# Patient Record
Sex: Female | Born: 1935 | Race: White | Hispanic: No | Smoking: Never smoker
Health system: Southern US, Community
[De-identification: ages and names within clinical notes are randomized; demographics above are authoritative.]

## PROBLEM LIST (undated history)

## (undated) DIAGNOSIS — R002 Palpitations: Secondary | ICD-10-CM

## (undated) DIAGNOSIS — Z66 Do not resuscitate: Secondary | ICD-10-CM

## (undated) DIAGNOSIS — Z9889 Other specified postprocedural states: Secondary | ICD-10-CM

## (undated) DIAGNOSIS — R110 Nausea: Secondary | ICD-10-CM

## (undated) DIAGNOSIS — R55 Syncope and collapse: Secondary | ICD-10-CM

## (undated) DIAGNOSIS — C801 Malignant (primary) neoplasm, unspecified: Secondary | ICD-10-CM

## (undated) DIAGNOSIS — I10 Essential (primary) hypertension: Secondary | ICD-10-CM

## (undated) DIAGNOSIS — K219 Gastro-esophageal reflux disease without esophagitis: Secondary | ICD-10-CM

## (undated) DIAGNOSIS — K591 Functional diarrhea: Secondary | ICD-10-CM

## (undated) DIAGNOSIS — H269 Unspecified cataract: Secondary | ICD-10-CM

## (undated) DIAGNOSIS — I639 Cerebral infarction, unspecified: Secondary | ICD-10-CM

## (undated) DIAGNOSIS — R112 Nausea with vomiting, unspecified: Secondary | ICD-10-CM

## (undated) DIAGNOSIS — F0390 Unspecified dementia without behavioral disturbance: Secondary | ICD-10-CM

## (undated) DIAGNOSIS — I2510 Atherosclerotic heart disease of native coronary artery without angina pectoris: Secondary | ICD-10-CM

## (undated) DIAGNOSIS — H47099 Other disorders of optic nerve, not elsewhere classified, unspecified eye: Secondary | ICD-10-CM

## (undated) DIAGNOSIS — G450 Vertebro-basilar artery syndrome: Secondary | ICD-10-CM

## (undated) DIAGNOSIS — J189 Pneumonia, unspecified organism: Secondary | ICD-10-CM

## (undated) DIAGNOSIS — D649 Anemia, unspecified: Secondary | ICD-10-CM

## (undated) DIAGNOSIS — R5382 Chronic fatigue, unspecified: Secondary | ICD-10-CM

## (undated) HISTORY — DX: Cerebral infarction, unspecified: I63.9

## (undated) HISTORY — DX: Anemia, unspecified: D64.9

## (undated) HISTORY — DX: Atherosclerotic heart disease of native coronary artery without angina pectoris: I25.10

## (undated) HISTORY — PX: CATARACT EXTRACTION, BILATERAL: SHX1313

## (undated) HISTORY — PX: RECTAL POLYPECTOMY: SHX2309

## (undated) HISTORY — DX: Essential (primary) hypertension: I10

## (undated) HISTORY — DX: Vertebro-basilar artery syndrome: G45.0

## (undated) HISTORY — PX: POLYPECTOMY: SHX149

## (undated) HISTORY — DX: Nausea: R11.0

## (undated) HISTORY — DX: Other disorders of optic nerve, not elsewhere classified, unspecified eye: H47.099

## (undated) HISTORY — DX: Unspecified cataract: H26.9

## (undated) HISTORY — DX: Syncope and collapse: R55

## (undated) HISTORY — DX: Gastro-esophageal reflux disease without esophagitis: K21.9

## (undated) HISTORY — DX: Palpitations: R00.2

## (undated) HISTORY — PX: CHOLECYSTECTOMY: SHX55

## (undated) HISTORY — DX: Do not resuscitate: Z66

## (undated) HISTORY — DX: Chronic fatigue, unspecified: R53.82

## (undated) HISTORY — PX: UPPER GASTROINTESTINAL ENDOSCOPY: SHX188

## (undated) HISTORY — PX: COLONOSCOPY: SHX174

## (undated) HISTORY — PX: OTHER SURGICAL HISTORY: SHX169

---

## 1998-07-15 ENCOUNTER — Other Ambulatory Visit: Admission: RE | Admit: 1998-07-15 | Discharge: 1998-07-15 | Payer: Self-pay | Admitting: Family Medicine

## 1999-09-16 ENCOUNTER — Other Ambulatory Visit: Admission: RE | Admit: 1999-09-16 | Discharge: 1999-09-16 | Payer: Self-pay | Admitting: Family Medicine

## 2000-11-15 ENCOUNTER — Other Ambulatory Visit: Admission: RE | Admit: 2000-11-15 | Discharge: 2000-11-15 | Payer: Self-pay | Admitting: Family Medicine

## 2001-06-06 ENCOUNTER — Encounter (INDEPENDENT_AMBULATORY_CARE_PROVIDER_SITE_OTHER): Payer: Self-pay | Admitting: *Deleted

## 2001-08-09 ENCOUNTER — Encounter (INDEPENDENT_AMBULATORY_CARE_PROVIDER_SITE_OTHER): Payer: Self-pay | Admitting: *Deleted

## 2003-11-24 ENCOUNTER — Emergency Department (HOSPITAL_COMMUNITY): Admission: EM | Admit: 2003-11-24 | Discharge: 2003-11-24 | Payer: Self-pay | Admitting: Emergency Medicine

## 2003-11-29 ENCOUNTER — Encounter (INDEPENDENT_AMBULATORY_CARE_PROVIDER_SITE_OTHER): Payer: Self-pay | Admitting: *Deleted

## 2003-11-29 ENCOUNTER — Ambulatory Visit (HOSPITAL_COMMUNITY): Admission: RE | Admit: 2003-11-29 | Discharge: 2003-11-29 | Payer: Self-pay | Admitting: Cardiology

## 2003-12-17 ENCOUNTER — Encounter: Admission: RE | Admit: 2003-12-17 | Discharge: 2003-12-17 | Payer: Self-pay | Admitting: Cardiology

## 2004-05-23 ENCOUNTER — Ambulatory Visit (HOSPITAL_COMMUNITY): Admission: RE | Admit: 2004-05-23 | Discharge: 2004-05-23 | Payer: Self-pay | Admitting: Ophthalmology

## 2004-08-22 ENCOUNTER — Encounter: Admission: RE | Admit: 2004-08-22 | Discharge: 2004-08-22 | Payer: Self-pay | Admitting: Orthopedic Surgery

## 2005-03-16 ENCOUNTER — Other Ambulatory Visit: Admission: RE | Admit: 2005-03-16 | Discharge: 2005-03-16 | Payer: Self-pay | Admitting: Family Medicine

## 2005-03-30 DIAGNOSIS — R55 Syncope and collapse: Secondary | ICD-10-CM

## 2005-03-30 HISTORY — DX: Syncope and collapse: R55

## 2005-04-02 HISTORY — PX: TRANSTHORACIC ECHOCARDIOGRAM: SHX275

## 2005-10-04 ENCOUNTER — Emergency Department (HOSPITAL_COMMUNITY): Admission: EM | Admit: 2005-10-04 | Discharge: 2005-10-04 | Payer: Self-pay | Admitting: Emergency Medicine

## 2006-11-12 ENCOUNTER — Inpatient Hospital Stay (HOSPITAL_COMMUNITY): Admission: EM | Admit: 2006-11-12 | Discharge: 2006-11-14 | Payer: Self-pay | Admitting: Emergency Medicine

## 2008-06-27 ENCOUNTER — Encounter (INDEPENDENT_AMBULATORY_CARE_PROVIDER_SITE_OTHER): Payer: Self-pay | Admitting: *Deleted

## 2009-04-16 ENCOUNTER — Telehealth: Payer: Self-pay | Admitting: Internal Medicine

## 2009-11-10 ENCOUNTER — Emergency Department (HOSPITAL_COMMUNITY): Admission: EM | Admit: 2009-11-10 | Discharge: 2009-11-10 | Payer: Self-pay | Admitting: Emergency Medicine

## 2009-12-06 ENCOUNTER — Ambulatory Visit: Payer: Self-pay | Admitting: Cardiology

## 2010-03-19 ENCOUNTER — Ambulatory Visit: Payer: Self-pay | Admitting: Cardiology

## 2010-04-20 ENCOUNTER — Encounter: Payer: Self-pay | Admitting: Cardiology

## 2010-05-01 NOTE — Procedures (Signed)
Summary: COLON   Colonoscopy  Procedure date:  08/09/2001  Findings:      Location:  [facility].   Patient Name: Sheryl Shaw, Argueta MRN:  Procedure Procedures: Colonoscopy CPT: [phone].  Personnel: Endoscopist: Ulyess Mort, MD.  Exam Location: Exam performed in Outpatient Clinic. Outpatient  Patient Consent: Procedure, Alternatives, Risks and Benefits discussed, consent obtained, from patient. Consent was obtained by the RN.  Indications Symptoms: Diarrhea  Surveillance of: Adenomatous Polyp(s).  History Allergies: Allergic to codeine.  Pre-Exam Physical: Performed Aug 09, 2001. Abdominal exam, Neurological exam, Mental status exam WNL.  Exam Exam: Extent of exam reached: Cecum, extent intended: Cecum.  The cecum was identified by appendiceal orifice and IC valve. Colon retroflexion performed. Images were not taken. ASA Classification: II. Tolerance: good.  Monitoring: Pulse and BP monitoring, Oximetry used. Supplemental O2 given.  Colon Prep Prep results: fair, exam compromised.  Sedation Meds: Patient assessed and found to be appropriate for moderate (conscious) sedation. Fentanyl 100 mcg. given IV. Versed 5 mg. given IV.  Findings - DIVERTICULOSIS: Descending Colon to Sigmoid Colon. ICD9: Diverticulosis: 562.10. Comments: minnimal.  - STRICTURE / STENOSIS: Comments: anal sphincter scarring. ICD9: Anal Stricture: 569.2.   Assessment Abnormal examination, see findings above.  Diagnoses: 562.10: Diverticulosis.  569.2: Anal Stricture.   Events  Unplanned Interventions: No intervention was required.  Unplanned Events: There were no complications. Plans Medication Plan: Continue current medications.  Patient Education: Patient given standard instructions for: Yearly hemoccult testing recommended. Patient instructed to get routine colonoscopy every 5 years.  Disposition: After procedure patient sent to recovery. After recovery  patient sent home.   This report was created from the original endoscopy report, which was reviewed and signed by the above listed endoscopist.

## 2010-05-01 NOTE — Assessment & Plan Note (Signed)
Summary: Gastroenterology (Dr Corinda Gubler)  [name]  MR#:  [id number] Page #  [facility]   GASTROENTEROLOGY OFFICE NOTE  NAME:  Sheryl Shaw, Sheryl Shaw   OFFICE NO:  [id number]  DATE:  06/06/01  CHIEF COMPLAINT:  The patient comes in on 3/10 and says she is doing well.  No GI complaints.  She has an upper respiratory infection and just came back from [place].  The patient does have GERD and has a 4 cm hiatal hernia.  Last time she was endoscoped was in 1996 and was dilated accordingly with a 52 Maloney dilator.  She says she is really doing fairly well otherwise.  She said the Prilosec caused her some palpitations and she did not like the generic and wanted to go back to the regular.  PHYSICAL EXAMINATION:  She weighed 172 pounds.  Blood pressure 140/80.  Pulse 76 and regular.  Neck, heart, abdomen and extremities are all unremarkable.  IMPRESSION:  1.   Gastroesophageal reflux disease with stricture and spasm. 2.   Status post colon polyp.  RECOMMENDATIONS:  She can take regular Prilosec or maybe Nexium.  I told her to let us know if she has further difficulty with her dysphagia.  I also think she needs a colonoscopic examination.  All I can see is that she had one in 1994 at which time she had a small polyp.  She was to have one repeated in five years and this has not been done and I would recommend a followup colonoscopic examination.  Maybe at that time she ought to be endoscoped and dilated but I will leave that up to her and see how she is doing symptomatically prior to considering that.     Ulyess Mort, M.D.  [id number] D:  06/06/01; T:  ; Job [id number]

## 2010-05-01 NOTE — Progress Notes (Signed)
Summary: Need Colonoscopy or Office Visit?  Phone Note Outgoing Call   Call placed by: Hortense Ramal CMA Duncan Dull),  April 16, 2009 2:40 PM Call placed to: Patient Summary of Call: Called patient to advise her that it is time for her colonoscopy. She states that she was told by Dr Modena Jansky (who apparently no longer practices) whenever she told him that she got a letter saying it was time for a colonoscopy that she had 2 more years until she needed another procedure. I told her that we would check with you to get your thoughts. Patient is 77 years old but from the information in her paper chart, she is in faily good health with the only problems listed being GERD, hx of peptic stricture, hiatal hernia, anal stricture, diverticulosis and history of colonic polyps. Those polyps were found to be hyperplastic on a colonoscopy done in 1994. Patient wants to know if you think she really needs a colonoscopy? (If you would rather wait to see the paper chart, I can tell her that I will call her back next week) Initial call taken by: Hortense Ramal CMA Duncan Dull),  April 16, 2009 2:44 PM  Follow-up for Phone Call        If she is a healthy 77 y.o, and her last colon was in 1994, then I would recommend her to have another colonoscopy. But it is her choice. She is due for it. Follow-up by: Hart Carwin MD,  April 17, 2009 2:19 PM  Additional Follow-up for Phone Call Additional follow up Details #1::        Her last colon was in 2003 (in emr for your review) but it was not impressive, the 1994 colon is the one that showed polyps    Additional Follow-up for Phone Call Additional follow up Details #2::    Well, then she really does not need a colonoscopy till 2013 ( 10 years). She will be 77 years old and she will not likely need  another exam unless she is having problems. Follow-up by: Hart Carwin MD,  April 17, 2009 2:31 PM  Additional Follow-up for Phone Call Additional follow up Details #3:: Details  for Additional Follow-up Action Taken: I have left a message for patient to call back and I have entered a new recall into ID[employer] and EMR. Hortense Ramal CMA Duncan Dull)  April 18, 2009 12:51 PM   patient aware we will put recall in for 2013 and we will contact her to see how she is doing at that time. she understand and says thank you. Additional Follow-up by: Harlow Mares CMA Duncan Dull),  April 18, 2009 2:24 PM

## 2010-06-13 LAB — DIFFERENTIAL
Basophils Absolute: 0.1 10*3/uL (ref 0.0–0.1)
Basophils Relative: 1 % (ref 0–1)
Lymphs Abs: 1.6 10*3/uL (ref 0.7–4.0)
Neutro Abs: 4.8 10*3/uL (ref 1.7–7.7)

## 2010-06-13 LAB — CBC
MCH: 31 pg (ref 26.0–34.0)
MCHC: 34.2 g/dL (ref 30.0–36.0)
MCV: 90.6 fL (ref 78.0–100.0)
Platelets: 246 10*3/uL (ref 150–400)
RDW: 14.1 % (ref 11.5–15.5)

## 2010-06-13 LAB — POCT CARDIAC MARKERS
Myoglobin, poc: 89.7 ng/mL (ref 12–200)
Troponin i, poc: <0.05 ng/mL (ref 0.00–0.09)

## 2010-06-13 LAB — BASIC METABOLIC PANEL
CO2: 31 mEq/L (ref 19–32)
Chloride: 106 mEq/L (ref 96–112)
GFR calc Af Amer: 58 mL/min — ABNORMAL LOW (ref >60)
Glucose, Bld: 119 mg/dL — ABNORMAL HIGH (ref 70–99)
Potassium: 3.4 mEq/L — ABNORMAL LOW (ref 3.5–5.1)

## 2010-07-29 ENCOUNTER — Other Ambulatory Visit: Payer: Self-pay | Admitting: *Deleted

## 2010-07-29 DIAGNOSIS — E7800 Pure hypercholesterolemia, unspecified: Secondary | ICD-10-CM

## 2010-08-12 NOTE — Discharge Summary (Signed)
Sheryl Shaw, Sheryl Shaw             ACCOUNT NO.:  [id number]   MEDICAL RECORD NO.:  [id number]          PATIENT TYPE:  INP   LOCATION:  1540                         FACILITY:  [facility]   PHYSICIAN:  Barnetta Chapel, MDDATE OF BIRTH:  10/04/1924   DATE OF ADMISSION:  11/11/2006  DATE OF DISCHARGE:  11/14/2006                               DISCHARGE SUMMARY   PRIMARY CARE Antwyne Pingree:  Dr. Marny Lowenstein.   DISCHARGE DIAGNOSES:  1. Nausea and vomiting.  2. Diarrhea.  3. Dehydration.  4. Abnormal LFTs.  5. History of coronary artery disease.   DISCHARGE MEDS:  1. Atenolol 50 mg p.o. once daily.  2. HCTZ 12.5 mg p.o. once daily.  3. Lisinopril 10 mg p.o. once daily.  4. Plavix 75 mg p.o. once daily.  5. Aspirin 81 mg p.o. once daily.   CONSULTATIONS DONE:  None.   PERTINENT LABS ON ADMISSION:  UA revealed small leukocyte esterase and  the urine microscopy revealed a few bacteria.  However, urine culture  did not grow any organism.  AST was 77, ALT was 47, alkaline phosphatase  was 163.   BRIEF HISTORY AND HOSPITAL COURSE:  Please refer to the H&P done on  November 11, 2006.  The patient is an 77 year old female with a past  medical history significant for  coronary artery disease and  hypertension.  The patient presented with nausea, vomiting and diarrhea.  The patient was unable to keep anything down.  Apparently, the patient  had gone to visit some of the friends who also had symptoms of  gastroenteritis.   The patient was admitted as a case of acute gastroenteritis.  She was  kept n.p.o.  The nausea and vomiting was controlled with IV Zofran.  The  patient was adequately rehydrated.  UA revealed possible sign of UTI.  Urine culture done did not grow any organisms.  With the above  management, the patient improved significantly.  Nausea and vomiting  have resolved.  The patient is no longer having diarrhea.  The patient  is back to her baseline.  She will be discharged home  today to the care  of the primary care Charise Leinbach.   DISCHARGE PLANS:  1. Discharge patient back home today, but to follow with th primary      care Bodi Palmeri, Dr. Marny Lowenstein, in a      week.  2. Cardiac diet.  3. Activity as tolerated.      Barnetta Chapel, MD  Electronically Signed     SIO/MEDQ  D:  11/14/2006  T:  11/14/2006  Job:  [id number]   cc:   Jethro Bastos, M.D.  Fax: [phone]

## 2010-08-12 NOTE — H&P (Signed)
NAMESELMA, Sheryl Shaw             ACCOUNT NO.:  [id number]   MEDICAL RECORD NO.:  [id number]          PATIENT TYPE:  INP   LOCATION:  1540                         FACILITY:  [facility]   PHYSICIAN:  Hollice Espy, M.D.DATE OF BIRTH:  09-Mar-1925   DATE OF ADMISSION:  11/11/2006  DATE OF DISCHARGE:                              HISTORY & PHYSICAL   PRIMARY CARE PHYSICIAN:  Jethro Bastos, M.D.   CHIEF COMPLAINT:  Nausea, vomiting and diarrhea.   HISTORY OF PRESENT ILLNESS:  The patient is an 77 year old, white female  with past medical history of CAD and hypertension who for the past 3  days has been having complaints of worsening, nausea, vomiting and  diarrhea to the point where she cannot keep anything down.  She  previously has been well with no complaints.  She notes no fever, no  abdominal pain, no chest pain and no other complaints, just these  symptoms.  She lives alone and her daughter was worried about her being  able to take care of herself, so she brought her in for further  evaluation.  In the emergency room, labs were drawn and she was found to  have an elevated BUN of 27, but with a normal creatinine of 0.95.  Her  albumin was slightly low at 3.4.  Her transaminases and Alk phos were  minimally elevated and her white count was normal with no shift.  An  abdominal x-ray showed signs consistent with viral gastroenteritis.  It  was felt that the patient needed to come as she is unable to take p.o.  Currently, she says she is feeling a little bit better, a little less  nauseated.  She denies any headaches, vision changes, dysphagia, chest  pain, palpitations, shortness of breath, wheeze, cough, abdominal pain,  no hematuria or dysuria and no constipation.  She currently is not  having any diarrhea.  She denies any focal extremity, numbness, weakness  or pain.  She had a sense of generalized weakness.  Review of systems  otherwise negative.   PAST MEDICAL HISTORY:  1.  CAD.  2. Hypertension.   MEDICATIONS:  Aspirin, atenolol, Plavix, hydrochlorothiazide and  lisinopril.   ALLERGIES:  CODEINE.   SOCIAL HISTORY:  She denies any tobacco, alcohol or drug use.  She lives  alone.   FAMILY HISTORY:  Noncontributory.   PHYSICAL EXAMINATION:  VITAL SIGNS:  On admission, temperature 97.3,  heart rate 69, blood pressure 125/56, respirations 20, O2 saturations  97% on room air.  GENERAL:  The patient is alert and oriented x3 in no apparent distress.  HEENT:  Normocephalic, atraumatic.  Mucous membranes are moist.  She has  no carotid bruits.  HEART:  Regular rate and rhythm, S1, S2.  LUNGS:  Clear to auscultation bilaterally.  ABDOMEN:  Soft, nontender, hyperactive bowel sounds.  EXTREMITIES:  No clubbing, cyanosis or edema.   LABORATORY DATA AND X-RAY FINDINGS:  Acute abdominal series shows signs  consistent with viral gastroenteritis.   White count 9.2, H&H 13.1 and 38, MCV 89, platelet count 274 with no  shift.  Sodium 137, potassium 3.5,  chloride 100, bicarb 26, BUN 27,  creatinine 1, glucose 108.  LFTs are noted for an albumin of 3.4 and AST  of 69, Alk phos of 115.  UA with small leukocyte esterase, but only few  bacteria with 3-6 wbc's.   ASSESSMENT/PLAN:  1. Nausea, vomiting and diarrhea.  This is likely secondary to a      gastroenteritis.  Will symptomatically treat with antinausea and      pain medication plus Imodium and Protonix.  2. Coronary artery disease.  Will hold her antihypertensives until she      is better hydrated.  3. Brief elevation in transaminases.  Will recheck liver function test      about 10 hours later to ensure these are going down.  The patient      already has a previous history of having her gallbladder removed.      Hollice Espy, M.D.  Electronically Signed     SKK/MEDQ  D:  11/12/2006  T:  11/12/2006  Job:  [id number]   cc:   Jethro Bastos, M.D.  Fax: [phone]

## 2010-08-15 NOTE — H&P (Signed)
Sheryl Shaw, Sheryl Shaw             ACCOUNT NO.:  [id number]   MEDICAL RECORD NO.:  [id number]          PATIENT TYPE:  OIB   LOCATION:  2899                         FACILITY:  [facility]   PHYSICIAN:  Guadelupe Sabin, M.D.DATE OF BIRTH:  [birth year]   DATE OF ADMISSION:  05/23/2004  DATE OF DISCHARGE:                                HISTORY & PHYSICAL   This was a planned outpatient surgical admission of this 77 year old white  female admitted for cataract implant surgery of the left eye.   PRESENT ILLNESS:  This patient was first seen in my office on November 01, 2003  complaining of blurred vision greater in the left than right eye over the  past six months' duration. Examination revealed a visual acuity of 20/50 +2  right eye, 20/50 +2 left eye without correction and 20/40 minus right eye,  20/40 left eye with correction at distance. Near vision 20/40 right eye,  20/65 left eye. The patient was given a changing glasses in hopes of  improving her vision. When she returned, however, in January 2006 vision had  continued to deteriorate to 20/60 with correction in the left eye. The  patient elected to proceed with cataract implant surgery hoping for improved  vision. She was given oral discussion and printed information concerning the  procedure and its possible complications. She signed an informed consent and  arrangements were made for her outpatient admission at this time.   PAST MEDICAL HISTORY:  The patient is under the care of Dr. Dorothe Pea. Please  send him copies of these notes and any associated, laboratory data, EKG, or  chest x-ray material. Dr. Dorothe Pea notes the patient's good health for her  age with the history of arteriosclerotic heart disease followed by Dr.  Deborah Chalk. The patient has been placed on Plavix and aspirin which she has  discontinued on hold since May 19, 2004. She is felt to be a low risk  for the proposed surgery under local anesthesia.   REVIEW OF SYSTEMS:   No cardiorespiratory complaints.   PHYSICAL EXAMINATION:  VITAL SIGNS: As recorded on admission: Blood pressure  126/60, pulse 58, respirations 16, temperature 98.6.  GENERAL APPEARANCE: The patient is a pleasant 77 year old white female in no  acute distress.  HEENT: Eyes: Visual acuity as noted above. External ocular: The eyes are  white and clear with a clear cornea, deep and clear anterior chamber.  Nuclear cataract formation is present in both eyes. Dilated detailed fundus  examination reveals a clear vitreous, attached retina with normal optic  nerve, blood vessels, and macula. The left macula shows water to drusen, but  no hemorrhage, exudation, or neovascular membrane formation.  CHEST: Lungs clear to percussion/auscultation.  HEART: Normal sinus rhythm. No cardiomegaly. No murmurs.  ABDOMEN: Negative.  EXTREMITIES: Negative.   ADMISSION DIAGNOSIS:  Senile nuclear cataract greater in left than right  eye.   SURGICAL PLAN:  Cataract implant surgery left eye now, right eye later as  needed.      HNJ/MEDQ  D:  05/23/2004  T:  05/23/2004  Job:  [id number]   cc:   Marvis Repress.  Dorothe Pea, M.D.  [address]  [place]  [place] [address]  Fax: [phone]

## 2010-08-15 NOTE — H&P (Signed)
NAMEPENNIE, VANBLARCOM             ACCOUNT NO.:  [id number]   MEDICAL RECORD NO.:  [id number]          PATIENT TYPE:  OIB   LOCATION:  2899                         FACILITY:  [facility]   PHYSICIAN:  Guadelupe Sabin, M.D.DATE OF BIRTH:  1924-12-02   DATE OF ADMISSION:  05/23/2004  DATE OF DISCHARGE:  05/23/2004                                HISTORY & PHYSICAL   Date of outpatient surgery was 05/23/04.  I believe that I had already  dictated this history and physical with dictation number [phone].  I have the  operative note which I dictated later, but the operative admission was  recorded on this number.  Could you please check that for me and also send  me a copy of the admission note for my file.      HNJ/MEDQ  D:  07/27/2004  T:  07/27/2004  Job:  [id number]

## 2010-08-15 NOTE — Op Note (Signed)
NAMEDEROTHA, FISHBAUGH             ACCOUNT NO.:  [id number]   MEDICAL RECORD NO.:  [id number]          PATIENT TYPE:  OIB   LOCATION:  2899                         FACILITY:  [facility]   PHYSICIAN:  Guadelupe Sabin, M.D.DATE OF BIRTH:  Feb 25, 1925   DATE OF PROCEDURE:  05/23/2004  DATE OF DISCHARGE:                                 OPERATIVE REPORT   PREOPERATIVE DIAGNOSIS:  Senile nuclear cataract, left eye.   POSTOPERATIVE DIAGNOSIS:  Senile nuclear cataract, left eye.   NAME OF OPERATION:  Planned extracapsular cataract extraction --  phacoemulsification, primary insertion of posterior chamber intraocular lens  implant.   SURGEON:  Guadelupe Sabin, M.D.   ASSISTANT:  Nurse.   ANESTHESIA:  Local 4% Xylocaine, 0.75% Marcaine retrobulbar block with  Wydase added, topical tetracaine, intraocular Xylocaine, anesthesia standby  required in this elderly patient, patient given sodium Pentothal  intravenously during the period of retrobulbar blocking.   OPERATIVE PROCEDURE:  After the patient was prepped and draped, a lid  speculum was inserted and the left eye.  The eye was turned downward and a  superior rectus traction suture placed.  Schiotz tonometry was recorded at  10-12 scale units with a 5.5-gram weight.  A peritomy was performed adjacent  to the limbus from the 11 to 1 o'clock position.  The corneoscleral junction  was cleaned and a corneoscleral groove made with a 45-degree Superblade.  The anterior chamber was then entered with the 2.5-mm diamond keratome at  the 12 o'clock position and a 15-degree blade at the 2:30 position.  Using a  bent 26-gauge needle on an Ocucoat syringe, a circular capsulorrhexis was  begun and then completed with the Grabow forceps.  Hydrodissection and  hydrodelineation were performed using 1% Xylocaine.  A 30-degree  phacoemulsification tip was then inserted with slow controlled  emulsification of the lens nucleus.  Back-cracking and  fragmentation were  achieved with the Bechert pick,  total ultrasonic time -- 57 seconds,  average power level -- 11%,  total amount of fluid used -- 50 mL.  Following  removal of the nucleus, the residual cortex was aspirated with the  irrigation-aspiration tip.  The posterior capsule appeared intact with a  brilliant red fundus reflex; it was therefore elected to insert an [employer] [id number] silicone three-piece posterior chamber intraocular  lens implant, diopter strength +20.00; this was inserted with the McDonald  forceps into the anterior chamber and then centered into the capsular bag  using the [facility] lens rotator; the lens appeared to be well-centered.  The  Provisc and Ocucoat which had been used intermittently during the procedure  were aspirated and replaced with balanced salt solution and Miochol  ophthalmic solution.  The operative incisions appeared to be self-sealing.  It was elected, however, to place a single 10-0 interrupted radial suture  across the 12 o'clock incision to  ensure closure and to prevent endophthalmitis.  Maxitrol ointment was  instilled in the conjunctival cul-de-sac and a light patch and protector  shield applied.  Duration of procedure and anesthesia administration -- 45  minutes.  The patient tolerated the  procedure well in general and left the  operating room to the recovery room in good condition.      HNJ/MEDQ  D:  05/23/2004  T:  05/23/2004  Job:  [id number]

## 2010-08-29 ENCOUNTER — Other Ambulatory Visit (INDEPENDENT_AMBULATORY_CARE_PROVIDER_SITE_OTHER): Payer: Medicare Other | Admitting: *Deleted

## 2010-08-29 ENCOUNTER — Encounter: Payer: Self-pay | Admitting: Cardiology

## 2010-08-29 ENCOUNTER — Ambulatory Visit (INDEPENDENT_AMBULATORY_CARE_PROVIDER_SITE_OTHER): Payer: Medicare Other | Admitting: Cardiology

## 2010-08-29 DIAGNOSIS — R002 Palpitations: Secondary | ICD-10-CM | POA: Insufficient documentation

## 2010-08-29 DIAGNOSIS — R600 Localized edema: Secondary | ICD-10-CM | POA: Insufficient documentation

## 2010-08-29 DIAGNOSIS — R0602 Shortness of breath: Secondary | ICD-10-CM

## 2010-08-29 DIAGNOSIS — E7800 Pure hypercholesterolemia, unspecified: Secondary | ICD-10-CM

## 2010-08-29 DIAGNOSIS — Z8673 Personal history of transient ischemic attack (TIA), and cerebral infarction without residual deficits: Secondary | ICD-10-CM

## 2010-08-29 DIAGNOSIS — R609 Edema, unspecified: Secondary | ICD-10-CM

## 2010-08-29 LAB — HEPATIC FUNCTION PANEL
ALT: 14 U/L (ref 0–35)
AST: 16 U/L (ref 0–37)
Bilirubin, Direct: 0.1 mg/dL (ref 0.0–0.3)
Total Bilirubin: 0.7 mg/dL (ref 0.3–1.2)

## 2010-08-29 LAB — BRAIN NATRIURETIC PEPTIDE: Pro B Natriuretic peptide (BNP): 245 pg/mL — ABNORMAL HIGH (ref 0.0–100.0)

## 2010-08-29 LAB — LIPID PANEL
Cholesterol: 104 mg/dL (ref 0–200)
HDL: 42.5 mg/dL (ref >39.00)
LDL Cholesterol: 46 mg/dL (ref 0–99)
Triglycerides: 77 mg/dL (ref 0.0–149.0)

## 2010-08-29 LAB — BASIC METABOLIC PANEL
BUN: 19 mg/dL (ref 6–23)
GFR: 67.76 mL/min (ref >60.00)
Glucose, Bld: 99 mg/dL (ref 70–99)
Potassium: 3 mEq/L — ABNORMAL LOW (ref 3.5–5.1)

## 2010-08-29 NOTE — Assessment & Plan Note (Signed)
Will arrange for 2-D echocardiogram as well as B. Met, lipids and hepatic profile. We'll start her on Lasix 20 mg day 2 times a week but she can take it on a daily basis as long as she is 5 pounds over her usual weight and with lower extremity edema.

## 2010-08-29 NOTE — Progress Notes (Signed)
Subjective:   Sheryl Shaw is seen today for a followup visit. [name] been having lower extremity edema. She notes a significant weight gain associated with lower extremity edema. [name] not had any dietary indiscretion. [name] not having any chest pain. She has had one fall is somewhat more unsteady. She has a history of TIAs with repair posterior circulation is maintained on chronic Plavix. She is a history of hypertension, gastroesophageal reflux, palpitations, and syncope in 2007. 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    Current Outpatient Prescriptions  Medication Sig Dispense Refill  . aspirin 81 MG tablet Take 81 mg by mouth daily.        Marland Kitchen atenolol (TENORMIN) 50 MG tablet Take 50 mg by mouth daily.        . Calcium Carbonate-Vitamin D (CALCIUM-VITAMIN D) 500-200 MG-UNIT per tablet Take 1 tablet by mouth 2 (two) times daily with a meal.        . clopidogrel (PLAVIX) 75 MG tablet Take 75 mg by mouth daily.        Marland Kitchen lisinopril (PRINIVIL,ZESTRIL) 10 MG tablet Take 10 mg by mouth daily.        Marland Kitchen omeprazole (PRILOSEC) 20 MG capsule Take 20 mg by mouth daily.          Allergies  Allergen Reactions  . Codeine     There is no problem list on file for this patient.   History  Smoking status  . Never Smoker   Smokeless tobacco  . Never Used    History  Alcohol Use: Not on file    Family History  Problem Relation Age of Onset  . Heart disease Father   . Heart disease Brother   . Heart disease Brother   . Heart disease Brother   . Heart disease Brother   . Heart disease Brother   . Heart attack Brother   . Heart attack Brother   . Heart attack Brother   . Heart attack Father     Review of Systems:   The patient denies any heat or cold intolerance.  No weight gain or weight loss.  The patient denies headaches or blurry vision.  There is no cough or sputum production.  The patient denies dizziness.  There is no  hematuria or hematochezia.  The patient denies any muscle aches or arthritis.  The patient denies any rash.  The patient denies frequent falling or instability.  There is no history of depression or anxiety.  All other systems were reviewed and are negative.   Physical Exam:   Her weight is 153. Blood pressure is 170/92. Heart rate 64 regular.The head is normocephalic and atraumatic.  Pupils are equally round and reactive to light.  Sclerae nonicteric.  Conjunctiva is clear.  Oropharynx is unremarkable.  There's adequate oral airway.  Neck is supple there are no masses.  Thyroid is not enlarged.  There is no lymphadenopathy.  Lungs are clear.  Chest is symmetric.  Heart shows a regular rate and rhythm.  S1 and S2 are normal.  There is no murmur click or gallop.  Abdomen is soft normal bowel sounds.  There is no organomegaly.  Genital and rectal deferred.  Extremities are with moderate edema.  Peripheral pulses are adequate.  Neurologically intact.  Full range of motion.  The patient is not depressed.  Skin is warm and dry.  Assessment / Plan:

## 2010-08-29 NOTE — Assessment & Plan Note (Signed)
Will continue Plavix and aspirin daily. [name] not had recurrent posterior circulation TIAs.

## 2010-09-01 ENCOUNTER — Other Ambulatory Visit: Payer: Self-pay | Admitting: Cardiology

## 2010-09-01 DIAGNOSIS — R609 Edema, unspecified: Secondary | ICD-10-CM

## 2010-09-03 ENCOUNTER — Other Ambulatory Visit: Payer: Self-pay | Admitting: *Deleted

## 2010-09-03 ENCOUNTER — Telehealth: Payer: Self-pay | Admitting: *Deleted

## 2010-09-03 DIAGNOSIS — I10 Essential (primary) hypertension: Secondary | ICD-10-CM

## 2010-09-03 MED ORDER — POTASSIUM CHLORIDE CRYS ER 20 MEQ PO TBCR: 20.0000 meq | EXTENDED_RELEASE_TABLET | Freq: Every day | ORAL | Status: DC

## 2010-09-03 NOTE — Telephone Encounter (Signed)
Notified daughter of lab results. Will send in KCL Rx to [facility]. Will repeat BMET 2 wks. To see Dr. [place] in August. Will call results back in 2 wks.

## 2010-09-03 NOTE — Telephone Encounter (Signed)
Message copied by Lorayne Bender on Wed Sep 03, 2010 11:45 AM ------      Message from: Roger Shelter      Created: Wed Sep 03, 2010  8:36 AM       KCL 20 MEQ daily.  Recheck BMET in two weeks.

## 2010-09-04 ENCOUNTER — Encounter: Payer: Medicare Other | Admitting: *Deleted

## 2010-09-08 ENCOUNTER — Ambulatory Visit (HOSPITAL_COMMUNITY): Payer: Medicare Other | Attending: Cardiology

## 2010-09-08 DIAGNOSIS — I379 Nonrheumatic pulmonary valve disorder, unspecified: Secondary | ICD-10-CM | POA: Insufficient documentation

## 2010-09-08 DIAGNOSIS — I079 Rheumatic tricuspid valve disease, unspecified: Secondary | ICD-10-CM | POA: Insufficient documentation

## 2010-09-08 DIAGNOSIS — R55 Syncope and collapse: Secondary | ICD-10-CM | POA: Insufficient documentation

## 2010-09-08 DIAGNOSIS — R002 Palpitations: Secondary | ICD-10-CM | POA: Insufficient documentation

## 2010-09-08 DIAGNOSIS — I080 Rheumatic disorders of both mitral and aortic valves: Secondary | ICD-10-CM | POA: Insufficient documentation

## 2010-09-08 DIAGNOSIS — R609 Edema, unspecified: Secondary | ICD-10-CM

## 2010-09-09 ENCOUNTER — Telehealth: Payer: Self-pay | Admitting: *Deleted

## 2010-09-09 NOTE — Telephone Encounter (Signed)
Message copied by Antony Odea on Tue Sep 09, 2010  9:58 AM ------      Message from: Roger Shelter      Created: Mon Sep 08, 2010  2:15 PM       Nothing major, continue diuretics.

## 2010-09-09 NOTE — Telephone Encounter (Signed)
msg left per pt request on daughters phone. No change continue to f/u and ef 60-65% left on msg.Alfonso Ramus RN

## 2010-09-10 ENCOUNTER — Telehealth: Payer: Self-pay | Admitting: Cardiology

## 2010-09-10 NOTE — Telephone Encounter (Signed)
Left message for daughter Sue to call back.

## 2010-09-10 NOTE — Telephone Encounter (Signed)
Pt's daughter called She wanted to know if she should continue potassium daily please call

## 2010-09-12 NOTE — Telephone Encounter (Signed)
Left message for pt's daughter Fannie Knee that pt is to continue the Potassium 20 meq daily and come back in on 09/17/10 for repeat lab work.  Fannie Knee was instructed to call back with any concerns.

## 2010-09-17 ENCOUNTER — Other Ambulatory Visit: Payer: Medicare Other | Admitting: *Deleted

## 2010-09-17 ENCOUNTER — Other Ambulatory Visit (INDEPENDENT_AMBULATORY_CARE_PROVIDER_SITE_OTHER): Payer: Medicare Other | Admitting: *Deleted

## 2010-09-17 ENCOUNTER — Telehealth: Payer: Self-pay | Admitting: Cardiology

## 2010-09-17 ENCOUNTER — Telehealth: Payer: Self-pay | Admitting: *Deleted

## 2010-09-17 DIAGNOSIS — I10 Essential (primary) hypertension: Secondary | ICD-10-CM

## 2010-09-17 LAB — BASIC METABOLIC PANEL
CO2: 32 mEq/L (ref 19–32)
Chloride: 102 mEq/L (ref 96–112)
Sodium: 141 mEq/L (ref 135–145)

## 2010-09-17 NOTE — Telephone Encounter (Signed)
For KElly the patient is having a STAT potassium level test done today and they would like a copy of those test results faxed to them (fax: [phone]). They will be in their office at 2 pm today. Please send to the ATTN of Dr. Docia Chuck.

## 2010-09-17 NOTE — Telephone Encounter (Signed)
Pt's daughter Fannie Knee called, pt has been c/o feeling very weak, shaky, and confused.  Norma Fredrickson NP notified and instructed RN to have pt call PCP ASAP.  Fannie Knee notified of Lori's instructions and verbalized to RN understanding of instructions.

## 2010-09-17 NOTE — Telephone Encounter (Signed)
Pt's daughter Fannie Knee notified of BMET results.  BMET faxed to Dr. Docia Chuck.

## 2010-10-03 ENCOUNTER — Other Ambulatory Visit: Payer: Self-pay | Admitting: Cardiology

## 2010-10-03 NOTE — Telephone Encounter (Signed)
escribe medication per fax request  

## 2010-10-27 ENCOUNTER — Encounter: Payer: Self-pay | Admitting: Cardiology

## 2010-10-29 ENCOUNTER — Ambulatory Visit: Payer: Medicare Other | Admitting: Cardiology

## 2010-10-29 ENCOUNTER — Other Ambulatory Visit: Payer: Medicare Other | Admitting: *Deleted

## 2010-10-29 ENCOUNTER — Ambulatory Visit (INDEPENDENT_AMBULATORY_CARE_PROVIDER_SITE_OTHER): Payer: Medicare Other | Admitting: Cardiology

## 2010-10-29 ENCOUNTER — Other Ambulatory Visit (INDEPENDENT_AMBULATORY_CARE_PROVIDER_SITE_OTHER): Payer: Medicare Other | Admitting: *Deleted

## 2010-10-29 ENCOUNTER — Encounter: Payer: Self-pay | Admitting: Cardiology

## 2010-10-29 DIAGNOSIS — Z8673 Personal history of transient ischemic attack (TIA), and cerebral infarction without residual deficits: Secondary | ICD-10-CM

## 2010-10-29 DIAGNOSIS — I10 Essential (primary) hypertension: Secondary | ICD-10-CM

## 2010-10-29 DIAGNOSIS — R609 Edema, unspecified: Secondary | ICD-10-CM

## 2010-10-29 DIAGNOSIS — R600 Localized edema: Secondary | ICD-10-CM

## 2010-10-29 NOTE — Assessment & Plan Note (Signed)
She will continue with antiplatelet therapy with aspirin and Plavix.

## 2010-10-29 NOTE — Progress Notes (Signed)
Sheryl Shaw Date of Birth: [birth year]   History of Present Illness: Sheryl Shaw is seen to establish care today. She is a former patient of Dr. Deborah Chalk. She is an 77 year old white female who has a history of hypertension. She also has a history of vertebrobasilar TIAs and has been on chronic Plavix therapy. She really has no history of cardiac disease. She was evaluated in 2007 with a nuclear stress test which was normal. She also had an echocardiogram at that time which showed mild LVH and normal systolic function. On followup today she reports that she has had recent difficulty with increasing swelling in her legs and redness. She was seen by her primary care physician and treated initially with diuretics and later with antibiotics. The symptoms have resolved. She is no longer on diuretic therapy.  Current Outpatient Prescriptions on File Prior to Visit  Medication Sig Dispense Refill  . aspirin 81 MG tablet Take 81 mg by mouth daily.        Marland Kitchen atenolol (TENORMIN) 50 MG tablet TAKE 1 TABLET EVERY DAY  30 tablet  5  . Calcium Carbonate-Vitamin D (CALCIUM-VITAMIN D) 500-200 MG-UNIT per tablet Take 1 tablet by mouth 2 (two) times daily with a meal.        . clopidogrel (PLAVIX) 75 MG tablet Take 75 mg by mouth daily.        Marland Kitchen lisinopril (PRINIVIL,ZESTRIL) 10 MG tablet Take 10 mg by mouth daily.        Marland Kitchen omeprazole (PRILOSEC) 20 MG capsule Take 20 mg by mouth daily.          Allergies  Allergen Reactions  . Codeine     Past Medical History  Diagnosis Date  . Hypertension   . GERD (gastroesophageal reflux disease)   . Palpitations   . Chronic fatigue   . Syncope 2007  . Vertebrobasilar TIAs   . Optic nerve disorder     right    Past Surgical History  Procedure Date  . Rectal polypectomy   . Cataract extraction, bilateral   . Transthoracic echocardiogram 04/02/2005    EF 50-60%  . Esophageal dilitation     History  Smoking status  . Never Smoker   Smokeless tobacco    . Never Used    History  Alcohol Use No    Family History  Problem Relation Age of Onset  . Heart disease Father   . Heart disease Brother   . Heart disease Brother   . Heart disease Brother   . Heart disease Brother   . Heart disease Brother   . Heart attack Brother   . Heart attack Brother   . Heart attack Brother   . Heart attack Father     Review of Systems: The review of systems is positive for fatigue. Her blood pressure is somewhat labile with readings as low as 118 and as high as 164 systolic. All other systems were reviewed and are negative.  Physical Exam: BP 162/84  Pulse 64  Ht 5\' 7"  (1.702 m)  Wt 141 lb 12.8 oz (64.32 kg)  BMI 22.21 kg/m2 She is an elderly white female in no acute distress. She is normocephalic, atraumatic. Pupils are equal round and reactive. Sclera clear. Oropharynx is clear. Neck is without JVD, adenopathy, thyromegaly, or bruits. Lungs are clear. Cardiac exam reveals a regular rate and rhythm without gallop, murmur, or click. Abdomen is soft and nontender. She has no masses or hepatosplenomegaly. Femoral and pedal pulses are  palpable. She has no edema on exam or cellulitis. She walks with a cane. She is alert and oriented x3. Cranial nerves II through XII are intact. LABORATORY DATA: Recent echocardiogram shows mild LVH with normal systolic function. She had moderate mitral insufficiency and left atrial enlargement. Basic metabolic panel on June 20 was normal. Prior BNP level in early June was 245.  Assessment / Plan:

## 2010-10-29 NOTE — Patient Instructions (Signed)
Continue your current medications.  Avoid salt.  Keep your feet elevated when possible.  I will see you again in 6 months.

## 2010-10-29 NOTE — Assessment & Plan Note (Signed)
Blood pressure is elevated today but has been somewhat labile. I would avoid being overly aggressive in treating her blood pressure given her history of vertebrobasilar insufficiency. She will remain on atenolol and lisinopril. If her blood pressure remains consistently elevated I would recommend increasing her lisinopril. I will follow up again in 6 months.

## 2010-10-29 NOTE — Assessment & Plan Note (Signed)
Sounds like she had a recent early cellulitis. These symptoms have resolved. She is no longer on diuretic therapy. I do not see significant evidence of congestive heart failure. She has normal left ventricular systolic function.

## 2010-11-08 ENCOUNTER — Emergency Department (HOSPITAL_COMMUNITY): Payer: Medicare Other

## 2010-11-08 ENCOUNTER — Emergency Department (HOSPITAL_COMMUNITY)
Admission: EM | Admit: 2010-11-08 | Discharge: 2010-11-08 | Disposition: A | Discharge status: Home / Self Care | Payer: Medicare Other | Attending: Emergency Medicine | Admitting: Emergency Medicine

## 2010-11-08 DIAGNOSIS — R5381 Other malaise: Secondary | ICD-10-CM | POA: Insufficient documentation

## 2010-11-08 DIAGNOSIS — R4182 Altered mental status, unspecified: Secondary | ICD-10-CM | POA: Insufficient documentation

## 2010-11-08 DIAGNOSIS — F29 Unspecified psychosis not due to a substance or known physiological condition: Secondary | ICD-10-CM | POA: Insufficient documentation

## 2010-11-08 DIAGNOSIS — K219 Gastro-esophageal reflux disease without esophagitis: Secondary | ICD-10-CM | POA: Insufficient documentation

## 2010-11-08 DIAGNOSIS — E785 Hyperlipidemia, unspecified: Secondary | ICD-10-CM | POA: Insufficient documentation

## 2010-11-08 DIAGNOSIS — R197 Diarrhea, unspecified: Secondary | ICD-10-CM | POA: Insufficient documentation

## 2010-11-08 DIAGNOSIS — I10 Essential (primary) hypertension: Secondary | ICD-10-CM | POA: Insufficient documentation

## 2010-11-08 DIAGNOSIS — E876 Hypokalemia: Secondary | ICD-10-CM | POA: Insufficient documentation

## 2010-11-08 DIAGNOSIS — R5383 Other fatigue: Secondary | ICD-10-CM | POA: Insufficient documentation

## 2010-11-08 LAB — URINALYSIS, ROUTINE W REFLEX MICROSCOPIC
Ketones, ur: NEGATIVE mg/dL
Leukocytes, UA: NEGATIVE
Nitrite: NEGATIVE
Protein, ur: NEGATIVE mg/dL
Urobilinogen, UA: 1 mg/dL (ref 0.0–1.0)

## 2010-11-08 LAB — DIFFERENTIAL
Basophils Absolute: 0.1 10*3/uL (ref 0.0–0.1)
Eosinophils Absolute: 0.3 10*3/uL (ref 0.0–0.7)
Lymphocytes Relative: 31 % (ref 12–46)
Lymphs Abs: 2.3 10*3/uL (ref 0.7–4.0)
Neutro Abs: 3.9 10*3/uL (ref 1.7–7.7)

## 2010-11-08 LAB — CBC
MCH: 30 pg (ref 26.0–34.0)
MCHC: 34.4 g/dL (ref 30.0–36.0)
Platelets: 283 10*3/uL (ref 150–400)
RBC: 4.5 MIL/uL (ref 3.87–5.11)

## 2010-11-08 LAB — COMPREHENSIVE METABOLIC PANEL
AST: 47 U/L — ABNORMAL HIGH (ref 0–37)
Albumin: 3.8 g/dL (ref 3.5–5.2)
Calcium: 10.2 mg/dL (ref 8.4–10.5)
Creatinine, Ser: 0.86 mg/dL (ref 0.50–1.10)
GFR calc non Af Amer: >60 mL/min (ref >60)
Total Protein: 7.8 g/dL (ref 6.0–8.3)

## 2011-01-09 LAB — HEPATIC FUNCTION PANEL
ALT: 47 — ABNORMAL HIGH
AST: 70 — ABNORMAL HIGH
Albumin: 3 — ABNORMAL LOW
Alkaline Phosphatase: 163 — ABNORMAL HIGH
Total Bilirubin: 1.1

## 2011-01-09 LAB — HEPATITIS PANEL, ACUTE
HCV Ab: NEGATIVE
Hep A IgM: NEGATIVE
Hep B C IgM: NEGATIVE
Hepatitis B Surface Ag: NEGATIVE

## 2011-01-09 LAB — URINE CULTURE
Colony Count: NO GROWTH
Culture: NO GROWTH
Special Requests: NEGATIVE

## 2011-01-12 LAB — URINE MICROSCOPIC-ADD ON

## 2011-01-12 LAB — CBC
Hemoglobin: 13.1
MCHC: 34.2
RBC: 4.32

## 2011-01-12 LAB — DIFFERENTIAL
Basophils Relative: 0
Eosinophils Absolute: 0.1
Neutrophils Relative %: 66

## 2011-01-12 LAB — URINALYSIS, ROUTINE W REFLEX MICROSCOPIC
Ketones, ur: NEGATIVE
Nitrite: NEGATIVE
pH: 6.5

## 2011-01-12 LAB — COMPREHENSIVE METABOLIC PANEL
ALT: 43 — ABNORMAL HIGH
Alkaline Phosphatase: 155 — ABNORMAL HIGH
CO2: 26
Chloride: 100
GFR calc non Af Amer: 57 — ABNORMAL LOW
Glucose, Bld: 108 — ABNORMAL HIGH
Potassium: 3.5
Sodium: 137
Total Bilirubin: 1.3 — ABNORMAL HIGH

## 2011-04-02 ENCOUNTER — Ambulatory Visit (INDEPENDENT_AMBULATORY_CARE_PROVIDER_SITE_OTHER): Payer: Medicare Other | Admitting: Cardiology

## 2011-04-02 ENCOUNTER — Encounter: Payer: Self-pay | Admitting: Cardiology

## 2011-04-02 VITALS — BP 148/82 | HR 59 | Ht 67.0 in | Wt 131.0 lb

## 2011-04-02 DIAGNOSIS — E785 Hyperlipidemia, unspecified: Secondary | ICD-10-CM

## 2011-04-02 DIAGNOSIS — Z8673 Personal history of transient ischemic attack (TIA), and cerebral infarction without residual deficits: Secondary | ICD-10-CM

## 2011-04-02 DIAGNOSIS — I10 Essential (primary) hypertension: Secondary | ICD-10-CM

## 2011-04-02 MED ORDER — LISINOPRIL 10 MG PO TABS: 10.0000 mg | ORAL_TABLET | Freq: Every day | ORAL | Status: DC

## 2011-04-02 MED ORDER — ATENOLOL 50 MG PO TABS: 50.0000 mg | ORAL_TABLET | Freq: Every day | ORAL | Status: DC

## 2011-04-02 MED ORDER — CLOPIDOGREL BISULFATE 75 MG PO TABS: 75.0000 mg | ORAL_TABLET | Freq: Every day | ORAL | Status: DC

## 2011-04-02 NOTE — Assessment & Plan Note (Signed)
No recurrent symptoms. We will continue with Plavix therapy.

## 2011-04-02 NOTE — Assessment & Plan Note (Signed)
Blood pressure control is improved on current medical therapy.

## 2011-04-02 NOTE — Progress Notes (Signed)
Sheryl Shaw Date of Birth: [birth year]   History of Present Illness: Sheryl Shaw is seen for followup today. She is an 78 year old white female who has a history of hypertension. She also has a history of vertebrobasilar TIAs and has been on chronic Plavix therapy. She really has no history of cardiac disease. She was evaluated in 2007 with a nuclear stress test which was normal. She also had an echocardiogram at that time which showed mild LVH and normal systolic function. She reports that she has been feeling very well. [name] had no edema. Her blood pressure has been under control.  Current Outpatient Prescriptions on File Prior to Visit  Medication Sig Dispense Refill  . aspirin 81 MG tablet Take 81 mg by mouth daily.        . Calcium Carbonate-Vitamin D (CALCIUM-VITAMIN D) 500-200 MG-UNIT per tablet Take 1 tablet by mouth 2 (two) times daily with a meal.        . omeprazole (PRILOSEC) 20 MG capsule Take 20 mg by mouth daily.        Marland Kitchen DISCONTD: atenolol (TENORMIN) 50 MG tablet TAKE 1 TABLET EVERY DAY  30 tablet  5  . DISCONTD: clopidogrel (PLAVIX) 75 MG tablet Take 75 mg by mouth daily.        Marland Kitchen DISCONTD: lisinopril (PRINIVIL,ZESTRIL) 10 MG tablet Take 10 mg by mouth daily.          Allergies  Allergen Reactions  . Codeine     Past Medical History  Diagnosis Date  . Hypertension   . GERD (gastroesophageal reflux disease)   . Palpitations   . Chronic fatigue   . Syncope 2007  . Vertebrobasilar TIAs   . Optic nerve disorder     right  . Nausea   . Arteriosclerotic heart disease     Past Surgical History  Procedure Date  . Rectal polypectomy   . Cataract extraction, bilateral   . Transthoracic echocardiogram 04/02/2005    EF 50-60%  . Esophageal dilitation     History  Smoking status  . Never Smoker   Smokeless tobacco  . Never Used    History  Alcohol Use No    Family History  Problem Relation Age of Onset  . Heart disease Father   . Heart disease  Brother   . Heart disease Brother   . Heart disease Brother   . Heart disease Brother   . Heart disease Brother   . Heart attack Brother   . Heart attack Brother   . Heart attack Brother   . Heart attack Father     Review of Systems: As noted in history of present illness. All other systems were reviewed and are negative.  Physical Exam: BP 148/82  Pulse 59  Ht 5\' 7"  (1.702 m)  Wt 131 lb (59.421 kg)  BMI 20.52 kg/m2 She is an elderly white female in no acute distress. She is normocephalic, atraumatic. Pupils are equal round and reactive. Sclera clear. Oropharynx is clear. Neck is without JVD, adenopathy, thyromegaly, or bruits. Lungs are clear. Cardiac exam reveals a regular rate and rhythm without gallop, murmur, or click. Abdomen is soft and nontender. She has no masses or hepatosplenomegaly. Femoral and pedal pulses are palpable. She has no edema on exam or cellulitis. She walks with a cane. She is alert and oriented x3. Cranial nerves II through XII are intact. LABORATORY DATA: Recent echocardiogram shows mild LVH with normal systolic function. She had moderate mitral insufficiency and left  atrial enlargement. Basic metabolic panel on June 20 was normal. Prior BNP level in early June was 245.  Assessment / Plan:

## 2011-04-02 NOTE — Patient Instructions (Signed)
Continue your current medications.  I will see you in 6 months with fasting lab.

## 2011-10-02 ENCOUNTER — Other Ambulatory Visit (INDEPENDENT_AMBULATORY_CARE_PROVIDER_SITE_OTHER): Payer: Medicare Other

## 2011-10-02 DIAGNOSIS — Z8673 Personal history of transient ischemic attack (TIA), and cerebral infarction without residual deficits: Secondary | ICD-10-CM

## 2011-10-02 DIAGNOSIS — E785 Hyperlipidemia, unspecified: Secondary | ICD-10-CM

## 2011-10-02 DIAGNOSIS — I10 Essential (primary) hypertension: Secondary | ICD-10-CM

## 2011-10-02 LAB — LIPID PANEL
Cholesterol: 147 mg/dL (ref 0–200)
Total CHOL/HDL Ratio: 3
Triglycerides: 118 mg/dL (ref 0.0–149.0)

## 2011-10-02 LAB — HEPATIC FUNCTION PANEL
AST: 69 U/L — ABNORMAL HIGH (ref 0–37)
Albumin: 3.8 g/dL (ref 3.5–5.2)
Alkaline Phosphatase: 130 U/L — ABNORMAL HIGH (ref 39–117)
Total Protein: 7.6 g/dL (ref 6.0–8.3)

## 2011-10-02 LAB — BASIC METABOLIC PANEL
BUN: 34 mg/dL — ABNORMAL HIGH (ref 6–23)
CO2: 31 mEq/L (ref 19–32)
Calcium: 9.7 mg/dL (ref 8.4–10.5)
Creatinine, Ser: 0.9 mg/dL (ref 0.4–1.2)

## 2011-10-07 ENCOUNTER — Ambulatory Visit (INDEPENDENT_AMBULATORY_CARE_PROVIDER_SITE_OTHER): Payer: Medicare Other | Admitting: Cardiology

## 2011-10-07 ENCOUNTER — Encounter: Payer: Self-pay | Admitting: Cardiology

## 2011-10-07 VITALS — BP 118/64 | HR 62 | Ht 67.0 in | Wt 138.0 lb

## 2011-10-07 DIAGNOSIS — I10 Essential (primary) hypertension: Secondary | ICD-10-CM

## 2011-10-07 DIAGNOSIS — G459 Transient cerebral ischemic attack, unspecified: Secondary | ICD-10-CM

## 2011-10-07 DIAGNOSIS — Z8673 Personal history of transient ischemic attack (TIA), and cerebral infarction without residual deficits: Secondary | ICD-10-CM

## 2011-10-07 DIAGNOSIS — G450 Vertebro-basilar artery syndrome: Secondary | ICD-10-CM

## 2011-10-07 DIAGNOSIS — R7989 Other specified abnormal findings of blood chemistry: Secondary | ICD-10-CM

## 2011-10-07 NOTE — Assessment & Plan Note (Signed)
Her transaminases and alkaline phosphatase are elevated. She is not on statin therapy. I do not know the cause of her elevated LFTs and I don't see that this is ever been evaluated. I recommended that she follow up with her primary care.

## 2011-10-07 NOTE — Assessment & Plan Note (Signed)
[name] had no recurrent neurologic symptoms. She does complain of easy bruisability so we will stop her aspirin and continue Plavix only.

## 2011-10-07 NOTE — Patient Instructions (Signed)
Stop taking ASA  Continue your other medication  Follow up with Dr. Docia Chuck for your liver function abnormality

## 2011-10-07 NOTE — Assessment & Plan Note (Signed)
Blood pressure is under excellent control on Tenormin and lisinopril.

## 2011-10-07 NOTE — Progress Notes (Signed)
Sheryl Shaw Date of Birth: January 03, 1925   History of Present Illness: Sheryl Shaw is seen for followup today. She is an 78 year old white female who has a history of hypertension. She also has a history of vertebrobasilar TIAs and has been on chronic Plavix therapy. She really has no history of cardiac disease. She was evaluated in 2007 with a nuclear stress test which was normal. She also had an echocardiogram at that time which showed mild LVH and normal systolic function. She continues to do very well. She has had no recurrent dizziness or other neurologic symptoms. She has had no chest pain or shortness of breath. She is one of 13 children and her last remaining sister died this past week.   Current Outpatient Prescriptions on File Prior to Visit  Medication Sig Dispense Refill  . aspirin 81 MG tablet Take 81 mg by mouth daily.        Marland Kitchen atenolol (TENORMIN) 50 MG tablet Take 1 tablet (50 mg total) by mouth daily.  30 tablet  11  . Calcium Carbonate-Vitamin D (CALCIUM-VITAMIN D) 500-200 MG-UNIT per tablet Take 1 tablet by mouth 2 (two) times daily with a meal.        . clopidogrel (PLAVIX) 75 MG tablet Take 1 tablet (75 mg total) by mouth daily.  30 tablet  11  . lisinopril (PRINIVIL,ZESTRIL) 10 MG tablet Take 1 tablet (10 mg total) by mouth daily.  30 tablet  11  . omeprazole (PRILOSEC) 20 MG capsule Take 20 mg by mouth daily.          Allergies  Allergen Reactions  . Codeine     Past Medical History  Diagnosis Date  . Hypertension   . GERD (gastroesophageal reflux disease)   . Palpitations   . Chronic fatigue   . Syncope 2007  . Vertebrobasilar TIAs   . Optic nerve disorder     right  . Nausea   . Arteriosclerotic heart disease     Past Surgical History  Procedure Date  . Rectal polypectomy   . Cataract extraction, bilateral   . Transthoracic echocardiogram 04/02/2005    EF 50-60%  . Esophageal dilitation     History  Smoking status  . Never Smoker     Smokeless tobacco  . Never Used    History  Alcohol Use No    Family History  Problem Relation Age of Onset  . Heart disease Father   . Heart attack Father   . Heart disease Brother   . Heart disease Brother   . Heart disease Brother   . Heart disease Brother   . Heart disease Brother   . Heart attack Brother   . Heart attack Brother   . Heart attack Brother     Review of Systems: As noted in history of present illness. All other systems were reviewed and are negative.  Physical Exam: BP 118/64  Pulse 62  Ht 5\' 7"  (1.702 m)  Wt 138 lb (62.596 kg)  BMI 21.61 kg/m2 She is an elderly white female in no acute distress. She is normocephalic, atraumatic. Pupils are equal round and reactive. Sclera clear. Oropharynx is clear. Neck is without JVD, adenopathy, thyromegaly, or bruits. Lungs are clear. Cardiac exam reveals a regular rate and rhythm without gallop, murmur, or click. Abdomen is soft and nontender. She has no masses or hepatosplenomegaly. Femoral and pedal pulses are palpable. She has no edema on exam or cellulitis. She walks with a cane. She is alert  and oriented x3. Cranial nerves II through XII are intact. LABORATORY DATA: Lab Results  Component Value Date   CHOL 147 10/02/2011   HDL 49.10 10/02/2011   LDLCALC 74 10/02/2011   TRIG 118.0 10/02/2011   CHOLHDL 3 10/02/2011   Elevated alkaline phosphatase of 130, AST of 69, and ALT of 73.  Assessment / Plan:

## 2011-10-12 ENCOUNTER — Telehealth: Payer: Self-pay | Admitting: Cardiology

## 2011-10-12 NOTE — Telephone Encounter (Signed)
New problem:  Per Thurston Hole patient daughter calling has some question regarding blood work that was done on 7/5. Results on 7/10.

## 2011-10-13 NOTE — Telephone Encounter (Signed)
Spoke to patient's daughter Thurston Hole she is concerned that her mother has to follow up with PCP about her elevated liver functions.Stated she noticed on her lab work that her liver has been elevated in the past and why now let her PCP follow up.Stated why has this not been taken care of before.States mother has just started seeing Dr.Jordan since this past January and she did not understand why this was not been done before.Advised Dr.Jordan out of office this week will check with him and call her back next week.

## 2011-10-13 NOTE — Telephone Encounter (Signed)
Patient's daughter Thurston Hole called phone rings busy.

## 2011-10-13 NOTE — Telephone Encounter (Signed)
PT'S DTR CALLING BACK, WRONG NUMBER WAS ENTERED, SHOULD HAVE BEEN [phone], PLS CALL BACK, EXCEPT FROM 1 TO 2 AND AFTER 4P

## 2011-10-22 NOTE — Telephone Encounter (Signed)
Patient's daughter Anne called phone rings busy. 

## 2011-10-23 NOTE — Telephone Encounter (Signed)
Patient's daughter Thurston Hole called no answer.Patient called was told to have daughter call me back next week.

## 2011-10-28 ENCOUNTER — Telehealth: Payer: Self-pay | Admitting: Cardiology

## 2011-10-28 NOTE — Telephone Encounter (Signed)
dtr anne rtn call from last week to cheryl, also wants to know if we have poa for her and sister sue, if not she will fax them, she does have the fax number, pls call

## 2011-10-28 NOTE — Telephone Encounter (Signed)
Spoke to patient's daughter Thurston Hole she stated patient had repeat hepatic panel at Dr.Koirala's office 10/27/11 and was told normal.States she still dont understand why the one she had done here a few weeks ago was abnormal.Advised to talk with Dr.Koirala.She also stated she will be faxing a copy of POA to be put in patient's chart.

## 2011-10-28 NOTE — Telephone Encounter (Signed)
See note 10/28/11.

## 2011-11-02 ENCOUNTER — Other Ambulatory Visit: Payer: Self-pay | Admitting: Family Medicine

## 2011-11-11 ENCOUNTER — Other Ambulatory Visit: Payer: Self-pay | Admitting: Family Medicine

## 2011-11-11 ENCOUNTER — Ambulatory Visit
Admission: RE | Admit: 2011-11-11 | Discharge: 2011-11-11 | Disposition: A | Discharge status: Home / Self Care | Payer: Medicare Other | Source: Ambulatory Visit | Attending: Family Medicine | Admitting: Family Medicine

## 2012-03-11 ENCOUNTER — Other Ambulatory Visit: Payer: Self-pay

## 2012-03-11 MED ORDER — CLOPIDOGREL BISULFATE 75 MG PO TABS: 75.0000 mg | ORAL_TABLET | Freq: Every day | ORAL | Status: DC

## 2012-03-29 ENCOUNTER — Other Ambulatory Visit: Payer: Self-pay

## 2012-03-29 MED ORDER — ATENOLOL 50 MG PO TABS: 50.0000 mg | ORAL_TABLET | Freq: Every day | ORAL | Status: DC

## 2012-04-01 DIAGNOSIS — F329 Major depressive disorder, single episode, unspecified: Secondary | ICD-10-CM

## 2012-04-01 DIAGNOSIS — R413 Other amnesia: Secondary | ICD-10-CM

## 2012-05-23 ENCOUNTER — Other Ambulatory Visit: Payer: Self-pay

## 2012-05-23 MED ORDER — ATENOLOL 50 MG PO TABS: 50.0000 mg | ORAL_TABLET | Freq: Every day | ORAL | Status: DC

## 2012-07-21 ENCOUNTER — Other Ambulatory Visit: Payer: Self-pay | Admitting: *Deleted

## 2012-07-23 ENCOUNTER — Emergency Department (HOSPITAL_COMMUNITY): Payer: No Typology Code available for payment source

## 2012-07-23 ENCOUNTER — Emergency Department (HOSPITAL_COMMUNITY)
Admission: EM | Admit: 2012-07-23 | Discharge: 2012-07-23 | Disposition: A | Discharge status: Home / Self Care | Payer: No Typology Code available for payment source | Attending: Emergency Medicine | Admitting: Emergency Medicine

## 2012-07-23 DIAGNOSIS — Z8673 Personal history of transient ischemic attack (TIA), and cerebral infarction without residual deficits: Secondary | ICD-10-CM | POA: Insufficient documentation

## 2012-07-23 DIAGNOSIS — S20219A Contusion of unspecified front wall of thorax, initial encounter: Secondary | ICD-10-CM | POA: Insufficient documentation

## 2012-07-23 DIAGNOSIS — Z8679 Personal history of other diseases of the circulatory system: Secondary | ICD-10-CM | POA: Insufficient documentation

## 2012-07-23 DIAGNOSIS — IMO0002 Reserved for concepts with insufficient information to code with codable children: Secondary | ICD-10-CM | POA: Insufficient documentation

## 2012-07-23 DIAGNOSIS — T148XXA Other injury of unspecified body region, initial encounter: Secondary | ICD-10-CM

## 2012-07-23 DIAGNOSIS — I10 Essential (primary) hypertension: Secondary | ICD-10-CM | POA: Insufficient documentation

## 2012-07-23 DIAGNOSIS — Z8669 Personal history of other diseases of the nervous system and sense organs: Secondary | ICD-10-CM | POA: Insufficient documentation

## 2012-07-23 DIAGNOSIS — Z79899 Other long term (current) drug therapy: Secondary | ICD-10-CM | POA: Insufficient documentation

## 2012-07-23 DIAGNOSIS — S20212A Contusion of left front wall of thorax, initial encounter: Secondary | ICD-10-CM

## 2012-07-23 DIAGNOSIS — Y92410 Unspecified street and highway as the place of occurrence of the external cause: Secondary | ICD-10-CM | POA: Insufficient documentation

## 2012-07-23 DIAGNOSIS — Y9389 Activity, other specified: Secondary | ICD-10-CM | POA: Insufficient documentation

## 2012-07-23 LAB — POCT I-STAT, CHEM 8
BUN: 20 mg/dL (ref 6–23)
Calcium, Ion: 1.18 mmol/L (ref 1.13–1.30)
Creatinine, Ser: 0.9 mg/dL (ref 0.50–1.10)
Hemoglobin: 12.6 g/dL (ref 12.0–15.0)
Sodium: 145 mEq/L (ref 135–145)
TCO2: 28 mmol/L (ref 0–100)

## 2012-07-23 MED ORDER — NAPROXEN 500 MG PO TABS: 500.0000 mg | ORAL_TABLET | Freq: Two times a day (BID) | ORAL | Status: DC

## 2012-07-23 MED ORDER — ACETAMINOPHEN-CODEINE #3 300-30 MG PO TABS: 1.0000 | ORAL_TABLET | Freq: Four times a day (QID) | ORAL | Status: DC | PRN

## 2012-07-23 MED ORDER — ACETAMINOPHEN 500 MG PO TABS
500.0000 mg | ORAL_TABLET | Freq: Once | ORAL | Status: AC
  Administered 2012-07-23: 500 mg via ORAL
  Filled 2012-07-23: qty 1

## 2012-07-23 MED ORDER — IOHEXOL 300 MG/ML  SOLN
80.0000 mL | Freq: Once | INTRAMUSCULAR | Status: AC | PRN
  Administered 2012-07-23: 80 mL via INTRAVENOUS
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
## 2012-07-23 NOTE — ED Notes (Signed)
Family at bedside. 
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
## 2012-07-25 ENCOUNTER — Other Ambulatory Visit: Payer: Self-pay | Admitting: Oncology

## 2012-07-26 ENCOUNTER — Other Ambulatory Visit: Payer: Self-pay | Admitting: Cardiology

## 2012-07-27 NOTE — Telephone Encounter (Signed)
Patient Instructions  Stop taking ASA  Continue your other medication  Follow up with Dr. Docia Chuck for your liver function abnormality  Chart Reviewed By  Charna Elizabeth, LPN  on 1/91/4782  9:25 AM     Previous Visit     Provider Department Encounter #  10/02/2011  8:30 AM Peter [place], MD [facility] [id number]

## 2012-07-29 ENCOUNTER — Telehealth: Payer: Self-pay | Admitting: Internal Medicine

## 2012-07-29 NOTE — Telephone Encounter (Signed)
Lm for Sheryl Shaw to call back. Chart has been purged and in the warehouse.

## 2012-08-01 NOTE — Telephone Encounter (Signed)
Scheduled with Willette Cluster, NP on 08/03/12 at 10:30 AM. Yvonne Kendall will notify patient.

## 2012-08-03 ENCOUNTER — Ambulatory Visit: Payer: Medicare Other | Admitting: Nurse Practitioner

## 2013-03-20 ENCOUNTER — Other Ambulatory Visit: Payer: Self-pay | Admitting: Oncology

## 2013-09-06 ENCOUNTER — Encounter (HOSPITAL_COMMUNITY): Payer: Self-pay | Admitting: Emergency Medicine

## 2013-09-06 ENCOUNTER — Emergency Department (HOSPITAL_COMMUNITY)
Admission: EM | Admit: 2013-09-06 | Discharge: 2013-09-06 | Disposition: A | Discharge status: Home / Self Care | Payer: Medicare Other | Attending: Emergency Medicine | Admitting: Emergency Medicine

## 2013-09-06 ENCOUNTER — Emergency Department (HOSPITAL_COMMUNITY): Payer: Medicare Other

## 2013-09-06 DIAGNOSIS — Z79899 Other long term (current) drug therapy: Secondary | ICD-10-CM | POA: Insufficient documentation

## 2013-09-06 DIAGNOSIS — Z7902 Long term (current) use of antithrombotics/antiplatelets: Secondary | ICD-10-CM | POA: Insufficient documentation

## 2013-09-06 DIAGNOSIS — S79919A Unspecified injury of unspecified hip, initial encounter: Secondary | ICD-10-CM | POA: Insufficient documentation

## 2013-09-06 DIAGNOSIS — Z8673 Personal history of transient ischemic attack (TIA), and cerebral infarction without residual deficits: Secondary | ICD-10-CM | POA: Insufficient documentation

## 2013-09-06 DIAGNOSIS — W19XXXA Unspecified fall, initial encounter: Secondary | ICD-10-CM

## 2013-09-06 DIAGNOSIS — R296 Repeated falls: Secondary | ICD-10-CM | POA: Insufficient documentation

## 2013-09-06 DIAGNOSIS — S51019A Laceration without foreign body of unspecified elbow, initial encounter: Secondary | ICD-10-CM

## 2013-09-06 DIAGNOSIS — Y929 Unspecified place or not applicable: Secondary | ICD-10-CM | POA: Insufficient documentation

## 2013-09-06 DIAGNOSIS — Y939 Activity, unspecified: Secondary | ICD-10-CM | POA: Insufficient documentation

## 2013-09-06 DIAGNOSIS — S99929A Unspecified injury of unspecified foot, initial encounter: Secondary | ICD-10-CM

## 2013-09-06 DIAGNOSIS — Z8669 Personal history of other diseases of the nervous system and sense organs: Secondary | ICD-10-CM | POA: Insufficient documentation

## 2013-09-06 DIAGNOSIS — S8990XA Unspecified injury of unspecified lower leg, initial encounter: Secondary | ICD-10-CM | POA: Insufficient documentation

## 2013-09-06 DIAGNOSIS — S51009A Unspecified open wound of unspecified elbow, initial encounter: Secondary | ICD-10-CM | POA: Insufficient documentation

## 2013-09-06 DIAGNOSIS — I10 Essential (primary) hypertension: Secondary | ICD-10-CM | POA: Insufficient documentation

## 2013-09-06 DIAGNOSIS — F29 Unspecified psychosis not due to a substance or known physiological condition: Secondary | ICD-10-CM | POA: Insufficient documentation

## 2013-09-06 DIAGNOSIS — S79929A Unspecified injury of unspecified thigh, initial encounter: Secondary | ICD-10-CM

## 2013-09-06 DIAGNOSIS — S99919A Unspecified injury of unspecified ankle, initial encounter: Secondary | ICD-10-CM

## 2013-09-06 DIAGNOSIS — Y92129 Unspecified place in nursing home as the place of occurrence of the external cause: Secondary | ICD-10-CM

## 2013-09-06 LAB — URINALYSIS, ROUTINE W REFLEX MICROSCOPIC
BILIRUBIN URINE: NEGATIVE
Glucose, UA: NEGATIVE mg/dL
Hgb urine dipstick: NEGATIVE
Ketones, ur: NEGATIVE mg/dL
LEUKOCYTES UA: NEGATIVE
NITRITE: NEGATIVE
PH: 7.5 (ref 5.0–8.0)
Protein, ur: NEGATIVE mg/dL
SPECIFIC GRAVITY, URINE: 1.009 (ref 1.005–1.030)
Urobilinogen, UA: 1 mg/dL (ref 0.0–1.0)

## 2013-09-06 NOTE — ED Notes (Signed)
2 assist ambulation. Pt was very wobbly. Daughter states that is her norm and she usually ambulates with walker at facility

## 2013-09-06 NOTE — ED Provider Notes (Signed)
CSN: [id number]     Arrival date & time 09/06/13  1606 History   First MD Initiated Contact with Patient 09/06/13 1607     Chief Complaint  Patient presents with  . Fall     (Consider location/radiation/quality/duration/timing/severity/associated sxs/prior Treatment) HPI Comments: Grenda Lora is a 80 y.o. Female presenting with a fall occuring around 2:30 pm this afternoon.  She was taking a nap at her assisted living facility and fell trying to get out of bed.  She landed on her left side and also hit her chin and left temple area on the bed railing during the fall but not on the floor.  She did land on her left hip and has complaint of pain here and at her left knee and elbow as well as a deep abrasion of the elbow.  She denies loc.  She is on plavix due to history of cva.  Daughter at bedside reports she has gait problems at baseline and uses a walker.  Daughter endorses mild confusion at present,  She has asked here several times today why she hasn't visited her today which is unusual.  She reports 2 weeks ago she had a brief episode of hallucinations, now resolved.  Work up for that event was negative.  She has had no fevers, chills, no recent illnesses.     The history is provided by the patient.    Past Medical History  Diagnosis Date  . Hypertension   . GERD (gastroesophageal reflux disease)   . Palpitations   . Chronic fatigue   . Syncope 2007  . Vertebrobasilar TIAs   . Optic nerve disorder     right  . Nausea   . Arteriosclerotic heart disease    Past Surgical History  Procedure Laterality Date  . Rectal polypectomy    . Cataract extraction, bilateral    . Transthoracic echocardiogram  04/02/2005    EF 50-60%  . Esophageal dilitation     Family History  Problem Relation Age of Onset  . Heart disease Father   . Heart attack Father   . Heart disease Brother   . Heart disease Brother   . Heart disease Brother   . Heart disease Brother   . Heart disease Brother    . Heart attack Brother   . Heart attack Brother   . Heart attack Brother    History  Substance Use Topics  . Smoking status: Never Smoker   . Smokeless tobacco: Never Used  . Alcohol Use: No   OB History   Grav Para Term Preterm Abortions TAB SAB Ect Mult Living                 Review of Systems  Constitutional: Negative for fever and chills.  HENT: Negative for congestion and sore throat.   Eyes: Negative.   Respiratory: Negative for chest tightness and shortness of breath.   Cardiovascular: Negative for chest pain.  Gastrointestinal: Negative for nausea, vomiting and abdominal pain.  Genitourinary: Negative.   Musculoskeletal: Positive for arthralgias. Negative for joint swelling and neck pain.  Skin: Positive for wound. Negative for rash.  Neurological: Negative for dizziness, weakness, light-headedness, numbness and headaches.  Psychiatric/Behavioral: Negative.       Allergies  Codeine  Home Medications   Prior to Admission medications   Medication Sig Start Date End Date Taking? Authorizing Provider  acetaminophen (TYLENOL) 325 MG tablet Take 325 mg by mouth every 6 (six) hours as needed (pain).   Yes Historical  Provider, MD  atenolol (TENORMIN) 50 MG tablet Take 50 mg by mouth daily.   Yes Historical Provider, MD  calcium carbonate (TUMS - DOSED IN MG ELEMENTAL CALCIUM) 500 MG chewable tablet Chew 2 tablets by mouth every 8 (eight) hours as needed for indigestion or heartburn (reflux).   Yes Historical Provider, MD  clopidogrel (PLAVIX) 75 MG tablet Take 1 tablet (75 mg total) by mouth daily. 03/11/12  Yes Peter M Martinique, MD  furosemide (LASIX) 20 MG tablet Take 10 mg by mouth daily.   Yes Historical Provider, MD  lisinopril (PRINIVIL,ZESTRIL) 10 MG tablet Take 10 mg by mouth daily.    Yes Historical Provider, MD  loperamide (IMODIUM A-D) 2 MG tablet Take 2 mg by mouth as needed for diarrhea or loose stools.   Yes Historical Provider, MD  NON FORMULARY Take 1  each by mouth 2 (two) times daily with breakfast and lunch. "house shake"   Yes Historical Provider, MD  omeprazole (PRILOSEC) 20 MG capsule Take 20 mg by mouth daily.   Yes Historical Provider, MD  potassium chloride (K-DUR) 10 MEQ tablet Take 10 mEq by mouth daily.   Yes Historical Provider, MD   BP 124/48  Pulse 67  Temp(Src) 98.3 F (36.8 C) (Oral)  Resp 16  SpO2 98% Physical Exam  Nursing note and vitals reviewed. Constitutional: She appears well-developed and well-nourished. No distress.  Hard of hearing  HENT:  Head: Normocephalic.  Small nontender contusion left temple.   Eyes: Conjunctivae are normal.  Neck: Normal range of motion.  nontender  Cardiovascular: Normal rate, regular rhythm, normal heart sounds and intact distal pulses.   Pulmonary/Chest: Effort normal and breath sounds normal. She has no wheezes.  Abdominal: Soft. Bowel sounds are normal. There is no tenderness.  Musculoskeletal: Normal range of motion. She exhibits tenderness.  ttp left lateral hip over greater trochanter,  Left lateral knee,  Left elbow with no visible trauma or deformity except for skin tear left lateral elbow.  Neurological: She is alert.  Skin: Skin is warm and dry.  2 cm skin flap tear left elbow.    Psychiatric: She has a normal mood and affect.    ED Course  Procedures (including critical care time) Labs Review Labs Reviewed  URINALYSIS, ROUTINE W REFLEX MICROSCOPIC    Imaging Review Dg Elbow Complete Left  09/06/2013   CLINICAL DATA:  Fall  EXAM: LEFT ELBOW - COMPLETE 3+ VIEW  COMPARISON:  07/23/2012  FINDINGS: Negative for fracture or effusion. Calcification in the triceps tendon at the insertion. Negative for effusion. Small area of soft tissue calcification adjacent to the coronoid process may represent calcific tendinitis.  IMPRESSION: Negative for fracture.   Electronically Signed   By: Franchot Gallo M.D.   On: 09/06/2013 17:19   Dg Hip Complete Left  09/06/2013    CLINICAL DATA:  Status post fall with left hip pain.  EXAM: LEFT HIP - COMPLETE 2+ VIEW  COMPARISON:  MRI of the pelvis of Aug 22, 2004  FINDINGS: The bony pelvis is osteopenic. There is no acute fracture. There are degenerative changes of the lumbar spine. The AP and frog-leg lateral views of the left hip reveal no acute fracture and no significant degenerative change. The overlying soft tissues are unremarkable.  IMPRESSION: There is no acute fracture of the left hip.   Electronically Signed   By: David  Martinique   On: 09/06/2013 17:19   Ct Head Wo Contrast  09/06/2013   CLINICAL DATA:  Pain post trauma  EXAM: CT HEAD WITHOUT CONTRAST  CT CERVICAL SPINE WITHOUT CONTRAST  TECHNIQUE: Multidetector CT imaging of the head and cervical spine was performed following the standard protocol without intravenous contrast. Multiplanar CT image reconstructions of the cervical spine were also generated.  COMPARISON:  July 23, 2012  FINDINGS: CT head: Mild diffuse atrophy is stable. There is no mass, hemorrhage, extra-axial fluid collection, or midline shift. There is mild periventricular small vessel disease. There is no new gray-white compartment lesion. No acute appearing infarct present.  The bony calvarium appears intact. The mastoid air cells are clear. There is a degree of hyperostosis in the skull, stable.  CT cervical spine: There is no fracture or spondylolisthesis. Prevertebral soft tissues and predental space regions are normal. There is moderately severe disc space narrowing at C5-6 and C6-7. There is moderate disc space narrowing at C7-T1 and C4-5. There is milder narrowing at C2-3 and C3-4. There is facet osteoarthritic change to varying degrees at all levels, overall more severe on the left at nearly all levels compared to the right. No disc extrusion or stenosis. There is calcification in both carotid arteries.  IMPRESSION: CT head: Mild atrophy with mild periventricular small vessel disease. No intracranial  mass, hemorrhage, or extra-axial fluid.  CT cervical spine: Multilevel arthropathy. Atherosclerotic change in both carotid arteries. No fracture or spondylolisthesis.   Electronically Signed   By: Lowella Grip M.D.   On: 09/06/2013 17:29   Ct Cervical Spine Wo Contrast  09/06/2013   CLINICAL DATA:  Pain post trauma  EXAM: CT HEAD WITHOUT CONTRAST  CT CERVICAL SPINE WITHOUT CONTRAST  TECHNIQUE: Multidetector CT imaging of the head and cervical spine was performed following the standard protocol without intravenous contrast. Multiplanar CT image reconstructions of the cervical spine were also generated.  COMPARISON:  July 23, 2012  FINDINGS: CT head: Mild diffuse atrophy is stable. There is no mass, hemorrhage, extra-axial fluid collection, or midline shift. There is mild periventricular small vessel disease. There is no new gray-white compartment lesion. No acute appearing infarct present.  The bony calvarium appears intact. The mastoid air cells are clear. There is a degree of hyperostosis in the skull, stable.  CT cervical spine: There is no fracture or spondylolisthesis. Prevertebral soft tissues and predental space regions are normal. There is moderately severe disc space narrowing at C5-6 and C6-7. There is moderate disc space narrowing at C7-T1 and C4-5. There is milder narrowing at C2-3 and C3-4. There is facet osteoarthritic change to varying degrees at all levels, overall more severe on the left at nearly all levels compared to the right. No disc extrusion or stenosis. There is calcification in both carotid arteries.  IMPRESSION: CT head: Mild atrophy with mild periventricular small vessel disease. No intracranial mass, hemorrhage, or extra-axial fluid.  CT cervical spine: Multilevel arthropathy. Atherosclerotic change in both carotid arteries. No fracture or spondylolisthesis.   Electronically Signed   By: Lowella Grip M.D.   On: 09/06/2013 17:29   Dg Knee Complete 4 Views Left  09/06/2013    CLINICAL DATA:  Fall  EXAM: LEFT KNEE - COMPLETE 4+ VIEW  COMPARISON:  None.  FINDINGS: Mild joint space narrowing and spurring in the medial joint compartment. Lateral joint compartment is normal. No fracture or effusion. Early arterial calcification.  IMPRESSION: Medial degenerative change.  No acute abnormality.   Electronically Signed   By: Franchot Gallo M.D.   On: 09/06/2013 17:20     EKG Interpretation None  MDM   Final diagnoses:  Fall at nursing home  Skin tear of elbow without complication    Patients labs and/or radiological studies were viewed and considered during the medical decision making and disposition process. Pt was ambulated in ed and is weight bearing,  Baseline gait per dg which is unsteady at baseline.  She uses walker at the assisted living facility.    Sterile strips applied to elbow skin tear.  Pt tolerated well.  Prn f/u anticipated.    Evalee Jefferson, PA-C 09/06/13 1816

## 2013-09-06 NOTE — ED Notes (Signed)
Bed: WA25 Expected date:  Expected time:  Means of arrival:  Comments: EMS  
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
## 2013-09-06 NOTE — Discharge Instructions (Signed)
Fall Prevention and Home Safety Falls cause injuries and can affect all age groups. It is possible to use preventive measures to significantly decrease the likelihood of falls. There are many simple measures which can make your home safer and prevent falls. OUTDOORS  Repair cracks and edges of walkways and driveways.  Remove high doorway thresholds.  Trim shrubbery on the main path into your home.  Have good outside lighting.  Clear walkways of tools, rocks, debris, and clutter.  Check that handrails are not broken and are securely fastened. Both sides of steps should have handrails.  Have leaves, snow, and ice cleared regularly.  Use sand or salt on walkways during winter months.  In the garage, clean up grease or oil spills. BATHROOM  Install night lights.  Install grab bars by the toilet and in the tub and shower.  Use non-skid mats or decals in the tub or shower.  Place a plastic non-slip stool in the shower to sit on, if needed.  Keep floors dry and clean up all water on the floor immediately.  Remove soap buildup in the tub or shower on a regular basis.  Secure bath mats with non-slip, double-sided rug tape.  Remove throw rugs and tripping hazards from the floors. BEDROOMS  Install night lights.  Make sure a bedside light is easy to reach.  Do not use oversized bedding.  Keep a telephone by your bedside.  Have a firm chair with side arms to use for getting dressed.  Remove throw rugs and tripping hazards from the floor. KITCHEN  Keep handles on pots and pans turned toward the center of the stove. Use back burners when possible.  Clean up spills quickly and allow time for drying.  Avoid walking on wet floors.  Avoid hot utensils and knives.  Position shelves so they are not too high or low.  Place commonly used objects within easy reach.  If necessary, use a sturdy step stool with a grab bar when reaching.  Keep electrical cables out of the  way.  Do not use floor polish or wax that makes floors slippery. If you must use wax, use non-skid floor wax.  Remove throw rugs and tripping hazards from the floor. STAIRWAYS  Never leave objects on stairs.  Place handrails on both sides of stairways and use them. Fix any loose handrails. Make sure handrails on both sides of the stairways are as long as the stairs.  Check carpeting to make sure it is firmly attached along stairs. Make repairs to worn or loose carpet promptly.  Avoid placing throw rugs at the top or bottom of stairways, or properly secure the rug with carpet tape to prevent slippage. Get rid of throw rugs, if possible.  Have an electrician put in a light switch at the top and bottom of the stairs. OTHER FALL PREVENTION TIPS  Wear low-heel or rubber-soled shoes that are supportive and fit well. Wear closed toe shoes.  When using a stepladder, make sure it is fully opened and both spreaders are firmly locked. Do not climb a closed stepladder.  Add color or contrast paint or tape to grab bars and handrails in your home. Place contrasting color strips on first and last steps.  Learn and use mobility aids as needed. Install an electrical emergency response system.  Turn on lights to avoid dark areas. Replace light bulbs that burn out immediately. Get light switches that glow.  Arrange furniture to create clear pathways. Keep furniture in the same place.  Firmly attach carpet with non-skid or double-sided tape.  Eliminate uneven floor surfaces.  Select a carpet pattern that does not visually hide the edge of steps.  Be aware of all pets. OTHER HOME SAFETY TIPS  Set the water temperature for 120 F (48.8 C). Skin Tear Care A skin tear is a wound in which the top layer of skin has peeled off. This is a common problem with aging because the skin becomes thinner and more fragile as a person gets older. In addition, some medicines, such as oral corticosteroids, can  lead to skin thinning if taken for long periods of time.  A skin tear is often repaired with tape or skin adhesive strips. This keeps the skin that has been peeled off in contact with the healthier skin beneath. Depending on the location of the wound, a bandage (dressing) may be applied over the tape or skin adhesive strips. Sometimes, during the healing process, the skin turns black and dies. Even when this happens, the torn skin acts as a good dressing until the skin underneath gets healthier and repairs itself. HOME CARE INSTRUCTIONS  Change dressings once per day or as directed by your caregiver. Gently clean the skin tear and the area around the tear using saline solution or mild soap and water. Do not rub the injured skin dry. Let the area air dry. Apply petroleum jelly or an antibiotic cream or ointment to keep the tear moist. This will help the wound heal. Do not allow a scab to form. If the dressing sticks before the next dressing change, moisten it with warm soapy water and gently remove it. Protect the injured skin until it has healed. Only take over-the-counter or prescription medicines as directed by your caregiver. Take showers or baths using warm soapy water. Apply a new dressing after the shower or bath. Keep all follow-up appointments as directed by your caregiver.  SEEK IMMEDIATE MEDICAL CARE IF:  You have redness, swelling, or increasing pain in the skin tear. You havepus coming from the skin tear. You have chills. You have a red streak that goes away from the skin tear. You have a bad smell coming from the tear or dressing. You have a fever or persistent symptoms for more than 2 3 days. You have a fever and your symptoms suddenly get worse. MAKE SURE YOU: Understand these instructions. Will watch this condition. Will get help right away if your child is not doing well or gets worse. Document Released: 12/09/2000 Document Revised: 12/09/2011 Document Reviewed:  09/28/2011 [facility] Patient Information 2014 [place].   Keep emergency numbers on or near the telephone.  Keep smoke detectors on every level of the home and near sleeping areas. Document Released: 03/06/2002 Document Revised: 09/15/2011 Document Reviewed: 06/05/2011 [facility] Patient Information 2014 [place].

## 2013-09-06 NOTE — ED Notes (Signed)
Patient transported to Radiology 

## 2013-09-09 NOTE — ED Provider Notes (Signed)
Medical screening examination/treatment/procedure(s) were conducted as a shared visit with non-physician practitioner(s) and myself.  I personally evaluated the patient during the encounter.   EKG Interpretation None       Patient with fall, no significant injuries. Able to ambulate, low suspicion for occult hip injury. D/C  Ephraim Hamburger, MD 09/09/13 [phone]

## 2014-03-27 ENCOUNTER — Encounter (HOSPITAL_COMMUNITY): Payer: Self-pay | Admitting: Emergency Medicine

## 2014-03-27 ENCOUNTER — Emergency Department (HOSPITAL_COMMUNITY)
Admission: EM | Admit: 2014-03-27 | Discharge: 2014-03-27 | Disposition: A | Discharge status: Home / Self Care | Payer: Medicare Other | Attending: Emergency Medicine | Admitting: Emergency Medicine

## 2014-03-27 ENCOUNTER — Emergency Department (HOSPITAL_COMMUNITY): Payer: Medicare Other

## 2014-03-27 DIAGNOSIS — Z7902 Long term (current) use of antithrombotics/antiplatelets: Secondary | ICD-10-CM | POA: Insufficient documentation

## 2014-03-27 DIAGNOSIS — S60811A Abrasion of right wrist, initial encounter: Secondary | ICD-10-CM | POA: Insufficient documentation

## 2014-03-27 DIAGNOSIS — R52 Pain, unspecified: Secondary | ICD-10-CM

## 2014-03-27 DIAGNOSIS — Y998 Other external cause status: Secondary | ICD-10-CM | POA: Diagnosis not present

## 2014-03-27 DIAGNOSIS — S79911A Unspecified injury of right hip, initial encounter: Secondary | ICD-10-CM | POA: Diagnosis present

## 2014-03-27 DIAGNOSIS — W06XXXA Fall from bed, initial encounter: Secondary | ICD-10-CM | POA: Diagnosis not present

## 2014-03-27 DIAGNOSIS — K219 Gastro-esophageal reflux disease without esophagitis: Secondary | ICD-10-CM | POA: Diagnosis not present

## 2014-03-27 DIAGNOSIS — Y9289 Other specified places as the place of occurrence of the external cause: Secondary | ICD-10-CM | POA: Insufficient documentation

## 2014-03-27 DIAGNOSIS — I10 Essential (primary) hypertension: Secondary | ICD-10-CM | POA: Insufficient documentation

## 2014-03-27 DIAGNOSIS — Y9389 Activity, other specified: Secondary | ICD-10-CM | POA: Insufficient documentation

## 2014-03-27 DIAGNOSIS — S7001XA Contusion of right hip, initial encounter: Secondary | ICD-10-CM

## 2014-03-27 DIAGNOSIS — Z23 Encounter for immunization: Secondary | ICD-10-CM | POA: Diagnosis not present

## 2014-03-27 DIAGNOSIS — I2510 Atherosclerotic heart disease of native coronary artery without angina pectoris: Secondary | ICD-10-CM | POA: Insufficient documentation

## 2014-03-27 DIAGNOSIS — W19XXXA Unspecified fall, initial encounter: Secondary | ICD-10-CM

## 2014-03-27 DIAGNOSIS — Z79899 Other long term (current) drug therapy: Secondary | ICD-10-CM | POA: Diagnosis not present

## 2014-03-27 DIAGNOSIS — Z8669 Personal history of other diseases of the nervous system and sense organs: Secondary | ICD-10-CM | POA: Insufficient documentation

## 2014-03-27 DIAGNOSIS — S61511A Laceration without foreign body of right wrist, initial encounter: Secondary | ICD-10-CM

## 2014-03-27 MED ORDER — ACETAMINOPHEN 325 MG PO TABS
650.0000 mg | ORAL_TABLET | Freq: Once | ORAL | Status: AC
  Administered 2014-03-27: 650 mg via ORAL
  Filled 2014-03-27: qty 2

## 2014-03-27 MED ORDER — TETANUS-DIPHTH-ACELL PERTUSSIS 5-2.5-18.5 LF-MCG/0.5 IM SUSP
0.5000 mL | Freq: Once | INTRAMUSCULAR | Status: AC
  Administered 2014-03-27: 0.5 mL via INTRAMUSCULAR
  Filled 2014-03-27: qty 0.5

## 2014-03-27 NOTE — ED Notes (Signed)
Family at bedside. 

## 2014-03-27 NOTE — ED Notes (Signed)
Per EMS pt comes [place] pt fell out of bed onto her right side. Pt c/o right shoulder and hip pain. No deformity noted to hip and shoulder. Pt is alert and oriented. Pt is ambulatory with assistance. No LOC.

## 2014-03-27 NOTE — Discharge Instructions (Signed)
It was our pleasure to provide your ER care today - we hope that you feel better.  Keep wound to right wrist area very clean.  Was with warm soap and water 2x/day.  Take tylenol as need.  Return to ER if worse, new symptoms, severe pain, other concern.    Skin Tear Care A skin tear is a wound in which the top layer of skin has peeled off. This is a common problem with aging because the skin becomes thinner and more fragile as a person gets older. In addition, some medicines, such as oral corticosteroids, can lead to skin thinning if taken for long periods of time.  A skin tear is often repaired with tape or skin adhesive strips. This keeps the skin that has been peeled off in contact with the healthier skin beneath. Depending on the location of the wound, a bandage (dressing) may be applied over the tape or skin adhesive strips. Sometimes, during the healing process, the skin turns black and dies. Even when this happens, the torn skin acts as a good dressing until the skin underneath gets healthier and repairs itself. HOME CARE INSTRUCTIONS   Change dressings once per day or as directed by your caregiver.  Gently clean the skin tear and the area around the tear using saline solution or mild soap and water.  Do not rub the injured skin dry. Let the area air dry.  Apply petroleum jelly or an antibiotic cream or ointment to keep the tear moist. This will help the wound heal. Do not allow a scab to form.  If the dressing sticks before the next dressing change, moisten it with warm soapy water and gently remove it.  Protect the injured skin until it has healed.  Only take over-the-counter or prescription medicines as directed by your caregiver.  Take showers or baths using warm soapy water. Apply a new dressing after the shower or bath.  Keep all follow-up appointments as directed by your caregiver.  SEEK IMMEDIATE MEDICAL CARE IF:   You have redness, swelling, or increasing pain in the  skin tear.  You havepus coming from the skin tear.  You have chills.  You have a red streak that goes away from the skin tear.  You have a bad smell coming from the tear or dressing.  You have a fever or persistent symptoms for more than 2-3 days.  You have a fever and your symptoms suddenly get worse. MAKE SURE YOU:  Understand these instructions.  Will watch this condition.  Will get help right away if your child is not doing well or gets worse. Document Released: 12/09/2000 Document Revised: 12/09/2011 Document Reviewed: 09/28/2011 [facility] Patient Information 2015 [place], [place]. This information is not intended to replace advice given to you by your health care provider. Make sure you discuss any questions you have with your health care provider.     Fall Prevention and Home Safety Falls cause injuries and can affect all age groups. It is possible to use preventive measures to significantly decrease the likelihood of falls. There are many simple measures which can make your home safer and prevent falls. OUTDOORS  Repair cracks and edges of walkways and driveways.  Remove high doorway thresholds.  Trim shrubbery on the main path into your home.  Have good outside lighting.  Clear walkways of tools, rocks, debris, and clutter.  Check that handrails are not broken and are securely fastened. Both sides of steps should have handrails.  Have leaves, snow,  and ice cleared regularly.  Use sand or salt on walkways during winter months.  In the garage, clean up grease or oil spills. BATHROOM  Install night lights.  Install grab bars by the toilet and in the tub and shower.  Use non-skid mats or decals in the tub or shower.  Place a plastic non-slip stool in the shower to sit on, if needed.  Keep floors dry and clean up all water on the floor immediately.  Remove soap buildup in the tub or shower on a regular basis.  Secure bath mats with non-slip,  double-sided rug tape.  Remove throw rugs and tripping hazards from the floors. BEDROOMS  Install night lights.  Make sure a bedside light is easy to reach.  Do not use oversized bedding.  Keep a telephone by your bedside.  Have a firm chair with side arms to use for getting dressed.  Remove throw rugs and tripping hazards from the floor. KITCHEN  Keep handles on pots and pans turned toward the center of the stove. Use back burners when possible.  Clean up spills quickly and allow time for drying.  Avoid walking on wet floors.  Avoid hot utensils and knives.  Position shelves so they are not too high or low.  Place commonly used objects within easy reach.  If necessary, use a sturdy step stool with a grab bar when reaching.  Keep electrical cables out of the way.  Do not use floor polish or wax that makes floors slippery. If you must use wax, use non-skid floor wax.  Remove throw rugs and tripping hazards from the floor. STAIRWAYS  Never leave objects on stairs.  Place handrails on both sides of stairways and use them. Fix any loose handrails. Make sure handrails on both sides of the stairways are as long as the stairs.  Check carpeting to make sure it is firmly attached along stairs. Make repairs to worn or loose carpet promptly.  Avoid placing throw rugs at the top or bottom of stairways, or properly secure the rug with carpet tape to prevent slippage. Get rid of throw rugs, if possible.  Have an electrician put in a light switch at the top and bottom of the stairs. OTHER FALL PREVENTION TIPS  Wear low-heel or rubber-soled shoes that are supportive and fit well. Wear closed toe shoes.  When using a stepladder, make sure it is fully opened and both spreaders are firmly locked. Do not climb a closed stepladder.  Add color or contrast paint or tape to grab bars and handrails in your home. Place contrasting color strips on first and last steps.  Learn and use  mobility aids as needed. Install an electrical emergency response system.  Turn on lights to avoid dark areas. Replace light bulbs that burn out immediately. Get light switches that glow.  Arrange furniture to create clear pathways. Keep furniture in the same place.  Firmly attach carpet with non-skid or double-sided tape.  Eliminate uneven floor surfaces.  Select a carpet pattern that does not visually hide the edge of steps.  Be aware of all pets. OTHER HOME SAFETY TIPS  Set the water temperature for 120 F (48.8 C).  Keep emergency numbers on or near the telephone.  Keep smoke detectors on every level of the home and near sleeping areas. Document Released: 03/06/2002 Document Revised: 09/15/2011 Document Reviewed: 06/05/2011 [facility] Patient Information 2015 [place], [place]. This information is not intended to replace advice given to you by your health care provider.  Make sure you discuss any questions you have with your health care provider. ° °

## 2014-03-27 NOTE — ED Notes (Signed)
Bed: WA23 Expected date:  Expected time:  Means of arrival:  Comments: EMS-fall 

## 2014-03-27 NOTE — ED Provider Notes (Signed)
CSN: [id number]     Arrival date & time 03/27/14  0654 History   First MD Initiated Contact with Patient 03/27/14 [phone]     Chief Complaint  Patient presents with  . Fall     (Consider location/radiation/quality/duration/timing/severity/associated sxs/prior Treatment) Patient is a 80 y.o. female presenting with fall. The history is provided by the patient and the EMS personnel.  Fall Pertinent negatives include no chest pain, no abdominal pain, no headaches and no shortness of breath.  pt [facility] via EMS after rolling out of bed this morning onto floor.  Pt c/o right hip pain. Mild, constant, dull, non radiating pain. Skin tear to right wrist, pt unsure of last tetanus. Pt denies other pain or injury. No loc. No headache. No neck or back pain. No numbness/weakness. No nv. States otherwise recent health at baseline.     Past Medical History  Diagnosis Date  . Hypertension   . GERD (gastroesophageal reflux disease)   . Palpitations   . Chronic fatigue   . Syncope 2007  . Vertebrobasilar TIAs   . Optic nerve disorder     right  . Nausea   . Arteriosclerotic heart disease    Past Surgical History  Procedure Laterality Date  . Rectal polypectomy    . Cataract extraction, bilateral    . Transthoracic echocardiogram  04/02/2005    EF 50-60%  . Esophageal dilitation     Family History  Problem Relation Age of Onset  . Heart disease Father   . Heart attack Father   . Heart disease Brother   . Heart disease Brother   . Heart disease Brother   . Heart disease Brother   . Heart disease Brother   . Heart attack Brother   . Heart attack Brother   . Heart attack Brother    History  Substance Use Topics  . Smoking status: Never Smoker   . Smokeless tobacco: Never Used  . Alcohol Use: No   OB History    No data available     Review of Systems  Constitutional: Negative for fever and chills.  HENT: Negative for nosebleeds.   Eyes: Negative for redness.   Respiratory: Negative for shortness of breath.   Cardiovascular: Negative for chest pain.  Gastrointestinal: Negative for nausea, vomiting and abdominal pain.  Genitourinary: Negative for flank pain.  Musculoskeletal: Negative for back pain and neck pain.  Skin: Negative for rash.  Neurological: Negative for weakness, numbness and headaches.  Hematological: Does not bruise/bleed easily.  Psychiatric/Behavioral: The patient is not nervous/anxious.       Allergies  Codeine  Home Medications   Prior to Admission medications   Medication Sig Start Date End Date Taking? Authorizing Provider  acetaminophen (TYLENOL) 325 MG tablet Take 325 mg by mouth every 6 (six) hours as needed (pain).    Historical Provider, MD  atenolol (TENORMIN) 50 MG tablet Take 50 mg by mouth daily.    Historical Provider, MD  calcium carbonate (TUMS - DOSED IN MG ELEMENTAL CALCIUM) 500 MG chewable tablet Chew 2 tablets by mouth every 8 (eight) hours as needed for indigestion or heartburn (reflux).    Historical Provider, MD  clopidogrel (PLAVIX) 75 MG tablet Take 1 tablet (75 mg total) by mouth daily. 03/11/12   Peter M Martinique, MD  furosemide (LASIX) 20 MG tablet Take 10 mg by mouth daily.    Historical Provider, MD  lisinopril (PRINIVIL,ZESTRIL) 10 MG tablet Take 10 mg by mouth daily.  Historical Provider, MD  loperamide (IMODIUM A-D) 2 MG tablet Take 2 mg by mouth as needed for diarrhea or loose stools.    Historical Provider, MD  NON FORMULARY Take 1 each by mouth 2 (two) times daily with breakfast and lunch. "house shake"    Historical Provider, MD  omeprazole (PRILOSEC) 20 MG capsule Take 20 mg by mouth daily.    Historical Provider, MD  potassium chloride (K-DUR) 10 MEQ tablet Take 10 mEq by mouth daily.    Historical Provider, MD   BP 107/66 mmHg  Pulse 90  Temp(Src) 97.7 F (36.5 C) (Oral)  Resp 14  SpO2 100% Physical Exam  Constitutional: She appears well-developed and well-nourished. No  distress.  HENT:  Head: Atraumatic.  Mouth/Throat: Oropharynx is clear and moist.  Eyes: Conjunctivae are normal. Pupils are equal, round, and reactive to light. No scleral icterus.  Neck: Normal range of motion. Neck supple. No tracheal deviation present.  Cardiovascular: Normal rate, regular rhythm, normal heart sounds and intact distal pulses.   Pulmonary/Chest: Effort normal and breath sounds normal. No respiratory distress. She exhibits no tenderness.  Abdominal: Soft. Normal appearance and bowel sounds are normal. She exhibits no distension. There is no tenderness.  Genitourinary:  No cva tenderness  Musculoskeletal: She exhibits no edema.  Tenderness right hip. Mild pain w rom right hip, otherwise good rom bil ext without pain or focal bony tenderness. No shortening or rotational deformity of extremities. Distal pulses palp.  CTLS spine, non tender, aligned, no step off. Superficial skin tear to dorsum right wrist.   Neurological: She is alert.  Awake and alert. +HOH. Speech clear/fluent. Motor intact bil, stre 5/5. sens grossly intact.   Skin: Skin is warm and dry. No rash noted. She is not diaphoretic.  Psychiatric: She has a normal mood and affect.  Nursing note and vitals reviewed.   ED Course  Procedures (including critical care time) Labs Review  Dg Hip Complete Right  03/27/2014   CLINICAL DATA:  [place] out of the head this morning. Pain of the right hip. Range of motion appears normal.  EXAM: RIGHT HIP - COMPLETE 2+ VIEW  COMPARISON:  None.  FINDINGS: There is no evidence of hip fracture or dislocation. There is no evidence of arthropathy or other focal bone abnormality.  IMPRESSION: Negative.   Electronically Signed   By: Nelson Chimes M.D.   On: 03/27/2014 07:43      MDM   Xrays.  Superficial skin tear to right wrist cleaned, sterile dressing.  Tylenol po.  Tetanus im.  Reviewed nursing notes and prior charts for additional history.   xrays neg, discussed w  pt.  Pt currently appears stable for d/c.    Mirna Mires, MD 03/27/14 [phone]

## 2014-04-30 ENCOUNTER — Inpatient Hospital Stay (HOSPITAL_COMMUNITY)
Admission: EM | Admit: 2014-04-30 | Discharge: 2014-05-04 | DRG: 871 | Disposition: A | Discharge status: Skilled Nursing Facility | Payer: Medicare Other | Attending: Internal Medicine | Admitting: Internal Medicine

## 2014-04-30 ENCOUNTER — Encounter (HOSPITAL_COMMUNITY): Payer: Self-pay | Admitting: Emergency Medicine

## 2014-04-30 ENCOUNTER — Emergency Department (HOSPITAL_COMMUNITY): Payer: Medicare Other

## 2014-04-30 DIAGNOSIS — Z7902 Long term (current) use of antithrombotics/antiplatelets: Secondary | ICD-10-CM | POA: Diagnosis not present

## 2014-04-30 DIAGNOSIS — R197 Diarrhea, unspecified: Secondary | ICD-10-CM | POA: Diagnosis present

## 2014-04-30 DIAGNOSIS — Z8673 Personal history of transient ischemic attack (TIA), and cerebral infarction without residual deficits: Secondary | ICD-10-CM

## 2014-04-30 DIAGNOSIS — A419 Sepsis, unspecified organism: Principal | ICD-10-CM | POA: Diagnosis present

## 2014-04-30 DIAGNOSIS — I2510 Atherosclerotic heart disease of native coronary artery without angina pectoris: Secondary | ICD-10-CM | POA: Diagnosis present

## 2014-04-30 DIAGNOSIS — I10 Essential (primary) hypertension: Secondary | ICD-10-CM | POA: Diagnosis present

## 2014-04-30 DIAGNOSIS — Z66 Do not resuscitate: Secondary | ICD-10-CM | POA: Diagnosis present

## 2014-04-30 DIAGNOSIS — Z9842 Cataract extraction status, left eye: Secondary | ICD-10-CM | POA: Diagnosis not present

## 2014-04-30 DIAGNOSIS — Z9841 Cataract extraction status, right eye: Secondary | ICD-10-CM

## 2014-04-30 DIAGNOSIS — E43 Unspecified severe protein-calorie malnutrition: Secondary | ICD-10-CM | POA: Insufficient documentation

## 2014-04-30 DIAGNOSIS — E876 Hypokalemia: Secondary | ICD-10-CM | POA: Diagnosis present

## 2014-04-30 DIAGNOSIS — Z886 Allergy status to analgesic agent status: Secondary | ICD-10-CM

## 2014-04-30 DIAGNOSIS — D72829 Elevated white blood cell count, unspecified: Secondary | ICD-10-CM

## 2014-04-30 DIAGNOSIS — K219 Gastro-esophageal reflux disease without esophagitis: Secondary | ICD-10-CM | POA: Diagnosis present

## 2014-04-30 DIAGNOSIS — R509 Fever, unspecified: Secondary | ICD-10-CM | POA: Diagnosis present

## 2014-04-30 DIAGNOSIS — Z681 Body mass index (BMI) 19 or less, adult: Secondary | ICD-10-CM

## 2014-04-30 DIAGNOSIS — F0390 Unspecified dementia without behavioral disturbance: Secondary | ICD-10-CM | POA: Diagnosis present

## 2014-04-30 DIAGNOSIS — R4182 Altered mental status, unspecified: Secondary | ICD-10-CM

## 2014-04-30 DIAGNOSIS — R6510 Systemic inflammatory response syndrome (SIRS) of non-infectious origin without acute organ dysfunction: Secondary | ICD-10-CM

## 2014-04-30 DIAGNOSIS — R112 Nausea with vomiting, unspecified: Secondary | ICD-10-CM | POA: Diagnosis present

## 2014-04-30 LAB — CBC WITH DIFFERENTIAL/PLATELET
Basophils Absolute: 0 10*3/uL (ref 0.0–0.1)
Basophils Relative: 0 % (ref 0–1)
Eosinophils Absolute: 0.1 10*3/uL (ref 0.0–0.7)
Eosinophils Relative: 0 % (ref 0–5)
HEMATOCRIT: 33.1 % — AB (ref 36.0–46.0)
Hemoglobin: 11 g/dL — ABNORMAL LOW (ref 12.0–15.0)
LYMPHS ABS: 1.3 10*3/uL (ref 0.7–4.0)
Lymphocytes Relative: 8 % — ABNORMAL LOW (ref 12–46)
MCH: 30.1 pg (ref 26.0–34.0)
MCHC: 33.2 g/dL (ref 30.0–36.0)
MCV: 90.7 fL (ref 78.0–100.0)
MONO ABS: 2 10*3/uL — AB (ref 0.1–1.0)
Monocytes Relative: 12 % (ref 3–12)
NEUTROS PCT: 80 % — AB (ref 43–77)
Neutro Abs: 13.8 10*3/uL — ABNORMAL HIGH (ref 1.7–7.7)
PLATELETS: 249 10*3/uL (ref 150–400)
RBC: 3.65 MIL/uL — AB (ref 3.87–5.11)
RDW: 14.2 % (ref 11.5–15.5)
WBC: 17.2 10*3/uL — ABNORMAL HIGH (ref 4.0–10.5)

## 2014-04-30 LAB — I-STAT CHEM 8, ED
BUN: 15 mg/dL (ref 6–23)
CALCIUM ION: 1.07 mmol/L — AB (ref 1.13–1.30)
CREATININE: 0.7 mg/dL (ref 0.50–1.10)
Chloride: 102 mmol/L (ref 96–112)
GLUCOSE: 127 mg/dL — AB (ref 70–99)
HEMATOCRIT: 36 % (ref 36.0–46.0)
Hemoglobin: 12.2 g/dL (ref 12.0–15.0)
POTASSIUM: 3.4 mmol/L — AB (ref 3.5–5.1)
Sodium: 140 mmol/L (ref 135–145)
TCO2: 21 mmol/L (ref 0–100)

## 2014-04-30 LAB — URINALYSIS, ROUTINE W REFLEX MICROSCOPIC
Bilirubin Urine: NEGATIVE
Glucose, UA: NEGATIVE mg/dL
Hgb urine dipstick: NEGATIVE
Ketones, ur: NEGATIVE mg/dL
LEUKOCYTES UA: NEGATIVE
NITRITE: NEGATIVE
Protein, ur: NEGATIVE mg/dL
Specific Gravity, Urine: 1.018 (ref 1.005–1.030)
Urobilinogen, UA: 1 mg/dL (ref 0.0–1.0)
pH: 5 (ref 5.0–8.0)

## 2014-04-30 LAB — I-STAT TROPONIN, ED: Troponin i, poc: 0 ng/mL (ref 0.00–0.08)

## 2014-04-30 LAB — HEPATIC FUNCTION PANEL
ALT: 22 U/L (ref 0–35)
AST: 26 U/L (ref 0–37)
Albumin: 3.2 g/dL — ABNORMAL LOW (ref 3.5–5.2)
Alkaline Phosphatase: 92 U/L (ref 39–117)
BILIRUBIN INDIRECT: 0.5 mg/dL (ref 0.3–0.9)
Bilirubin, Direct: 0.2 mg/dL (ref 0.0–0.5)
Total Bilirubin: 0.7 mg/dL (ref 0.3–1.2)
Total Protein: 6.6 g/dL (ref 6.0–8.3)

## 2014-04-30 LAB — I-STAT CG4 LACTIC ACID, ED: LACTIC ACID, VENOUS: 3.42 mmol/L — AB (ref 0.5–2.0)

## 2014-04-30 LAB — CBG MONITORING, ED: Glucose-Capillary: 120 mg/dL — ABNORMAL HIGH (ref 70–99)

## 2014-04-30 MED ORDER — DEXTROSE 5 % IV SOLN
1.0000 g | Freq: Once | INTRAVENOUS | Status: DC
  Administered 2014-04-30: 1 g via INTRAVENOUS
  Filled 2014-04-30: qty 10

## 2014-04-30 MED ORDER — ACETAMINOPHEN 325 MG PO TABS
650.0000 mg | ORAL_TABLET | Freq: Once | ORAL | Status: AC
  Administered 2014-04-30: 650 mg via ORAL
  Filled 2014-04-30: qty 2

## 2014-04-30 MED ORDER — PIPERACILLIN-TAZOBACTAM 3.375 G IVPB
3.3750 g | Freq: Three times a day (TID) | INTRAVENOUS | Status: DC
  Administered 2014-05-01 – 2014-05-03 (×7): 3.375 g via INTRAVENOUS
  Filled 2014-04-30 (×7): qty 50

## 2014-04-30 MED ORDER — VANCOMYCIN HCL IN DEXTROSE 750-5 MG/150ML-% IV SOLN
750.0000 mg | INTRAVENOUS | Status: DC
  Administered 2014-05-01 – 2014-05-02 (×2): 750 mg via INTRAVENOUS
  Filled 2014-04-30 (×2): qty 150

## 2014-04-30 MED ORDER — VANCOMYCIN HCL IN DEXTROSE 1-5 GM/200ML-% IV SOLN
1000.0000 mg | INTRAVENOUS | Status: AC
  Administered 2014-04-30: 1000 mg via INTRAVENOUS
  Filled 2014-04-30: qty 200

## 2014-04-30 MED ORDER — SODIUM CHLORIDE 0.9 % IV BOLUS (SEPSIS)
1000.0000 mL | Freq: Once | INTRAVENOUS | Status: AC
  Administered 2014-05-01: 1000 mL via INTRAVENOUS

## 2014-04-30 MED ORDER — PIPERACILLIN-TAZOBACTAM 3.375 G IVPB 30 MIN
3.3750 g | INTRAVENOUS | Status: AC
  Administered 2014-04-30: 3.375 g via INTRAVENOUS
  Filled 2014-04-30: qty 50

## 2014-04-30 NOTE — ED Notes (Signed)
Per EMS- according to family patient has been increasingly confused since Friday (hx dementia). Pt c/o of increased fatigue. Pt feels "warm to the touch." Emesis x1 today at lunch. Able to answer simple questions. Disoriented to date and time. Denies pain, SOB. VS: BP 122/50 HR 80 Regular SpO2 97% on 3L and CBG 153 mg/dl. Temporal thermometer reads 99.63F.

## 2014-04-30 NOTE — ED Notes (Signed)
Bed: WA20 Expected date:  Expected time:  Means of arrival:  Comments: EMS 81 yo from [place] [address]/generalized weakness/hot to touch

## 2014-04-30 NOTE — ED Notes (Signed)
Patient transported to CT 

## 2014-04-30 NOTE — ED Notes (Signed)
Pt able to swallow without reflexive coughing or drooling.

## 2014-04-30 NOTE — ED Provider Notes (Signed)
CSN: [id number]     Arrival date & time 04/30/14  2054 History   First MD Initiated Contact with Patient 04/30/14 2106     Chief Complaint  Patient presents with  . Fatigue  . Fever  . Altered Mental Status     (Consider location/radiation/quality/duration/timing/severity/associated sxs/prior Treatment) HPI   81 year old female who was brought here via EMS from a nursing facility for evaluations of fever and altered mental status. History is limited as patient is altered. According to family members, patient has increased confusion since Friday more than her baseline dementia. She is warm to the touch. She has 1 bouts of emesis today at lunch. Her mental changes is different compared to yesterday. Patient is able to answer simple questions. At this time she is complaining of low back pain only. She reports occasional cough. She denies having any severe headache, vision changes, chest pain, shortness of breath, abdominal pain, dysuria, numbness. Nurse report that her urine has a strong odor.  Past Medical History  Diagnosis Date  . Hypertension   . GERD (gastroesophageal reflux disease)   . Palpitations   . Chronic fatigue   . Syncope 2007  . Vertebrobasilar TIAs   . Optic nerve disorder     right  . Nausea   . Arteriosclerotic heart disease    Past Surgical History  Procedure Laterality Date  . Rectal polypectomy    . Cataract extraction, bilateral    . Transthoracic echocardiogram  04/02/2005    EF 50-60%  . Esophageal dilitation     Family History  Problem Relation Age of Onset  . Heart disease Father   . Heart attack Father   . Heart disease Brother   . Heart disease Brother   . Heart disease Brother   . Heart disease Brother   . Heart disease Brother   . Heart attack Brother   . Heart attack Brother   . Heart attack Brother    History  Substance Use Topics  . Smoking status: Never Smoker   . Smokeless tobacco: Never Used  . Alcohol Use: No   OB History    No  data available     Review of Systems  Unable to perform ROS: Mental status change  Constitutional: Positive for fever.      Allergies  Codeine  Home Medications   Prior to Admission medications   Medication Sig Start Date End Date Taking? Authorizing Provider  acetaminophen (TYLENOL) 325 MG tablet Take 325 mg by mouth every 6 (six) hours as needed (pain).    Historical Provider, MD  atenolol (TENORMIN) 50 MG tablet Take 50 mg by mouth daily.    Historical Provider, MD  calcium carbonate (TUMS - DOSED IN MG ELEMENTAL CALCIUM) 500 MG chewable tablet Chew 2 tablets by mouth every 8 (eight) hours as needed for indigestion or heartburn (reflux).    Historical Provider, MD  clopidogrel (PLAVIX) 75 MG tablet Take 1 tablet (75 mg total) by mouth daily. 03/11/12   Peter M Martinique, MD  furosemide (LASIX) 20 MG tablet Take 10 mg by mouth daily.    Historical Provider, MD  lisinopril (PRINIVIL,ZESTRIL) 10 MG tablet Take 10 mg by mouth daily.     Historical Provider, MD  loperamide (IMODIUM A-D) 2 MG tablet Take 2 mg by mouth as needed for diarrhea or loose stools.    Historical Provider, MD  NON FORMULARY Take 1 each by mouth 2 (two) times daily with breakfast and lunch. "house shake"  Historical Provider, MD  omeprazole (PRILOSEC) 20 MG capsule Take 20 mg by mouth daily.    Historical Provider, MD  potassium chloride (K-DUR) 10 MEQ tablet Take 10 mEq by mouth daily.    Historical Provider, MD  risperiDONE (RISPERDAL) 0.25 MG tablet Take 0.25 mg by mouth daily.    Historical Provider, MD   BP 126/66 mmHg  Pulse 78  Temp(Src) 101.4 F (38.6 C) (Rectal)  Resp 17  SpO2 100% Physical Exam  Constitutional: No distress.  Frail and cachectic female, warm to the touch, able to answer simple questions. Patient is hard of hearing.  HENT:  Head: Normocephalic and atraumatic.  Mouth/Throat: Oropharynx is clear and moist.  Patient wearing partial dentition  Eyes: Conjunctivae are normal.  Neck:  Normal range of motion. Neck supple.  No nuchal rigidity  Cardiovascular: Normal rate and regular rhythm.   Pulmonary/Chest: Effort normal and breath sounds normal.  Poor effort, but no obvious rales or rhonchi heard  Abdominal: Soft. Bowel sounds are normal. She exhibits no distension. There is no tenderness. There is no rebound.  Genitourinary:  Wearing adult diaper  Musculoskeletal:  Able to move all 4 extremities on command however with poor effort  Neurological: She is alert. GCS eye subscore is 4. GCS verbal subscore is 5. GCS motor subscore is 6.  Patient is alert to self and place but not to time and situation.  Skin: No rash noted.  Psychiatric: She has a normal mood and affect.  Nursing note and vitals reviewed.   ED Course  Procedures (including critical care time)   Patient is altered, and is running a fever of 101.4. Urine with strong odor likely source. Workup initiated, initiate antibiotic for suspected UTI. Otherwise she is with stable vital signs.  11:40 PM Fever of 101.4, lactic acid is 3.42, WBC of 17.2 with a left shift which is concerning for infection. Her urine shows no evidence of urinary tract infection, chest x-ray shows no evidence of pneumonia, head CT scan without acute intracranial abnormalities.  BP was normal initially but now she's hypotensive.  Will give IVF.  Care discussed with DR. Alcario Drought who agreees to admit pt to step down for further evaluation and management of fever of unknown source.     Labs Review Labs Reviewed  CBC WITH DIFFERENTIAL/PLATELET - Abnormal; Notable for the following:    WBC 17.2 (*)    RBC 3.65 (*)    Hemoglobin 11.0 (*)    HCT 33.1 (*)    Neutrophils Relative % 80 (*)    Neutro Abs 13.8 (*)    Lymphocytes Relative 8 (*)    Monocytes Absolute 2.0 (*)    All other components within normal limits  HEPATIC FUNCTION PANEL - Abnormal; Notable for the following:    Albumin 3.2 (*)    All other components within normal  limits  I-STAT CHEM 8, ED - Abnormal; Notable for the following:    Potassium 3.4 (*)    Glucose, Bld 127 (*)    Calcium, Ion 1.07 (*)    All other components within normal limits  CBG MONITORING, ED - Abnormal; Notable for the following:    Glucose-Capillary 120 (*)    All other components within normal limits  I-STAT CG4 LACTIC ACID, ED - Abnormal; Notable for the following:    Lactic Acid, Venous 3.42 (*)    All other components within normal limits  CULTURE, BLOOD (ROUTINE X 2)  CULTURE, BLOOD (ROUTINE X 2)  URINE CULTURE  URINALYSIS, ROUTINE W REFLEX MICROSCOPIC  I-STAT TROPOININ, ED    Imaging Review Ct Head Wo Contrast  04/30/2014   CLINICAL DATA:  Acute onset of altered mental status. Worsening confusion. Hypotension. Initial encounter.  EXAM: CT HEAD WITHOUT CONTRAST  TECHNIQUE: Contiguous axial images were obtained from the base of the skull through the vertex without intravenous contrast.  COMPARISON:  CT of the head performed 09/06/2013  FINDINGS: There is no evidence of acute infarction, mass lesion, or intra- or extra-axial hemorrhage on CT.  Prominence of the ventricles and sulci reflects moderate cortical volume loss. Scattered periventricular and subcortical white matter change likely reflects small vessel ischemic microangiopathy. Cerebellar atrophy is noted. A likely chronic lacunar infarct is noted at the brainstem.  The fourth ventricle is within normal limits. The basal ganglia are unremarkable in appearance. The cerebral hemispheres demonstrate grossly normal gray-white differentiation. No mass effect or midline shift is seen.  There is no evidence of fracture; visualized osseous structures are unremarkable in appearance. The orbits are within normal limits. The paranasal sinuses and mastoid air cells are well-aerated. No significant soft tissue abnormalities are seen.  IMPRESSION: 1. No acute intracranial pathology seen on CT. 2. Moderate cortical volume loss and scattered  small vessel ischemic microangiopathy. Chronic lacunar infarct suggested at the brainstem.   Electronically Signed   By: Garald Balding M.D.   On: 04/30/2014 22:52   Dg Chest Portable 1 View  04/30/2014   CLINICAL DATA:  Fatigue, fever, altered mental status. Symptoms for 1-3 days.  EXAM: PORTABLE CHEST - 1 VIEW  COMPARISON:  Chest CT 07/23/2012  FINDINGS: The heart is at the upper limits of normal in size. There is atherosclerosis of the thoracic aorta. Lung volumes are low, no airspace consolidation to suggest pneumonia. There is no large pleural effusion or pneumothorax. No pulmonary edema. No acute osseous abnormalities are seen, there is degenerative change in both shoulders.  IMPRESSION: No acute pulmonary process.   Electronically Signed   By: Jeb Levering M.D.   On: 04/30/2014 21:41     EKG Interpretation   Date/Time:  Monday April 30 2014 22:49:16 EST Ventricular Rate:  74 PR Interval:  148 QRS Duration: 94 QT Interval:  409 QTC Calculation: 454 R Axis:   12 Text Interpretation:  Sinus rhythm Atrial premature complex Borderline T  abnormalities, anterior leads No significant change since last tracing in  2011 Confirmed by WARD,  DO, KRISTEN [id number]) on 04/30/2014 11:01:54 PM      MDM   Final diagnoses:  Altered mental state  SIRS (systemic inflammatory response syndrome)  Leukocytosis  Fever, unknown origin    BP 90/41 mmHg  Pulse 65  Temp(Src) 100.3 F (37.9 C) (Rectal)  Resp 22  Ht 5\' 7"  (1.702 m)  Wt 115 lb (52.164 kg)  BMI 18.01 kg/m2  SpO2 98%  I have reviewed nursing notes and vital signs. I personally reviewed the imaging tests through PACS system  I reviewed available ER/hospitalization records thought the EMR     Domenic Moras, PA-C 04/30/14 2350

## 2014-04-30 NOTE — ED Notes (Signed)
Pt on her way to CT scan.

## 2014-04-30 NOTE — ED Provider Notes (Signed)
Medical screening examination/treatment/procedure(s) were conducted as a shared visit with non-physician practitioner(s) and myself.  I personally evaluated the patient during the encounter.   EKG Interpretation   Date/Time:  Monday April 30 2014 22:49:16 EST Ventricular Rate:  74 PR Interval:  148 QRS Duration: 94 QT Interval:  409 QTC Calculation: 454 R Axis:   12 Text Interpretation:  Sinus rhythm Atrial premature complex Borderline T  abnormalities, anterior leads No significant change since last tracing in  2011 Confirmed by Quiana Cobaugh,  DO, Andrian Urbach [phone]) on 04/30/2014 11:01:54 PM      Pt is a 81 y.o. female with history of dementia who was his emergency fever, fatigue, feeling poorly, altered mental status. Has a 101.4 here but is otherwise hemodynamically stable.  Lactate 3.42.  No meningismus. No rash. Lungs clear. Abdomen soft and nontender.  No obvious source of infection but cultures are pending. Will give broad-spectrum antibiotic. Will admit.  [place], DO 04/30/14 2302

## 2014-04-30 NOTE — Progress Notes (Signed)
ANTIBIOTIC CONSULT NOTE - INITIAL  Pharmacy Consult for Vancomycin and Zosyn Indication: rule out sepsis  Allergies  Allergen Reactions  . Codeine Other (See Comments)    Unknown (on MAR)    Patient Measurements: Height: 5\' 7"  (170.2 cm) Weight: 115 lb (52.164 kg) IBW/kg (Calculated) : 61.6  Vital Signs: Temp: 101.4 F (38.6 C) (02/01 2104) Temp Source: Rectal (02/01 2104) BP: 115/53 mmHg (02/01 2145) Pulse Rate: 87 (02/01 2145) Intake/Output from previous day:   Intake/Output from this shift:    Labs:  Recent Labs  04/30/14 2153  HGB 12.2  CREATININE 0.70   Estimated Creatinine Clearance: 40.8 mL/min (by C-G formula based on Cr of 0.7). No results for input(s): VANCOTROUGH, VANCOPEAK, VANCORANDOM, GENTTROUGH, GENTPEAK, GENTRANDOM, TOBRATROUGH, TOBRAPEAK, TOBRARND, AMIKACINPEAK, AMIKACINTROU, AMIKACIN in the last 72 hours.   Microbiology: No results found for this or any previous visit (from the past 720 hour(s)).  Medical History: Past Medical History  Diagnosis Date  . Hypertension   . GERD (gastroesophageal reflux disease)   . Palpitations   . Chronic fatigue   . Syncope 2007  . Vertebrobasilar TIAs   . Optic nerve disorder     right  . Nausea   . Arteriosclerotic heart disease     Medications:   (Not in a hospital admission)  Assessment: 81 yo female SNF resident with dementia brought to ED for evaulation of fever and AMS. Pharmacy is consulted to dose vancomycin and zosyn for presumed sepsis, urine likely source.  2/1 >> Vancomycin >> 2/1 >> Zosyn >>  Tmax: 101.4 WBC: pending Renal: SCr 0.7, CrCl 55 ml/min Normalized, 40 ml/min CG Lactate: 3.42  2/1 blood x 2: sent 2/1 urine: sent  Goal of Therapy:  Vancomycin trough level 15-20 mcg/ml  Zosyn dose per renal function  Plan:   Vancomycin 1g IV x 1, then 750mg  IV q24h Check trough at steady state Zosyn 3.375gm IV q8h (4hr extended infusions) Follow up renal function & cultures,  clinical course, de-escalation  Peggyann Juba, PharmD, BCPS Pager: [phone] 04/30/2014,10:11 PM

## 2014-05-01 ENCOUNTER — Inpatient Hospital Stay (HOSPITAL_COMMUNITY): Payer: Medicare Other

## 2014-05-01 DIAGNOSIS — R509 Fever, unspecified: Secondary | ICD-10-CM

## 2014-05-01 DIAGNOSIS — A419 Sepsis, unspecified organism: Secondary | ICD-10-CM | POA: Diagnosis present

## 2014-05-01 DIAGNOSIS — R197 Diarrhea, unspecified: Secondary | ICD-10-CM

## 2014-05-01 DIAGNOSIS — I10 Essential (primary) hypertension: Secondary | ICD-10-CM

## 2014-05-01 DIAGNOSIS — R112 Nausea with vomiting, unspecified: Secondary | ICD-10-CM | POA: Diagnosis present

## 2014-05-01 DIAGNOSIS — R6510 Systemic inflammatory response syndrome (SIRS) of non-infectious origin without acute organ dysfunction: Secondary | ICD-10-CM | POA: Diagnosis present

## 2014-05-01 LAB — URINE CULTURE
COLONY COUNT: NO GROWTH
Culture: NO GROWTH

## 2014-05-01 LAB — BASIC METABOLIC PANEL
ANION GAP: 8 (ref 5–15)
BUN: 16 mg/dL (ref 6–23)
CO2: 26 mmol/L (ref 19–32)
CREATININE: 0.7 mg/dL (ref 0.50–1.10)
Calcium: 8.1 mg/dL — ABNORMAL LOW (ref 8.4–10.5)
Chloride: 107 mmol/L (ref 96–112)
GFR, EST AFRICAN AMERICAN: 88 mL/min — AB (ref >90)
GFR, EST NON AFRICAN AMERICAN: 76 mL/min — AB (ref >90)
Glucose, Bld: 97 mg/dL (ref 70–99)
POTASSIUM: 3.3 mmol/L — AB (ref 3.5–5.1)
SODIUM: 141 mmol/L (ref 135–145)

## 2014-05-01 LAB — CBC
HEMATOCRIT: 30.9 % — AB (ref 36.0–46.0)
Hemoglobin: 10.1 g/dL — ABNORMAL LOW (ref 12.0–15.0)
MCH: 29.9 pg (ref 26.0–34.0)
MCHC: 32.7 g/dL (ref 30.0–36.0)
MCV: 91.4 fL (ref 78.0–100.0)
PLATELETS: 237 10*3/uL (ref 150–400)
RBC: 3.38 MIL/uL — AB (ref 3.87–5.11)
RDW: 14.4 % (ref 11.5–15.5)
WBC: 15.8 10*3/uL — ABNORMAL HIGH (ref 4.0–10.5)

## 2014-05-01 LAB — MRSA PCR SCREENING: MRSA by PCR: NEGATIVE

## 2014-05-01 LAB — LIPASE, BLOOD: Lipase: 20 U/L (ref 11–59)

## 2014-05-01 MED ORDER — CLOPIDOGREL BISULFATE 75 MG PO TABS
75.0000 mg | ORAL_TABLET | Freq: Every day | ORAL | Status: DC
  Administered 2014-05-01 – 2014-05-04 (×4): 75 mg via ORAL
  Filled 2014-05-01 (×7): qty 1

## 2014-05-01 MED ORDER — RISPERIDONE 0.25 MG PO TABS
0.2500 mg | ORAL_TABLET | Freq: Every day | ORAL | Status: DC
  Administered 2014-05-01 – 2014-05-04 (×4): 0.25 mg via ORAL
  Filled 2014-05-01 (×4): qty 1

## 2014-05-01 MED ORDER — PANTOPRAZOLE SODIUM 40 MG PO TBEC
40.0000 mg | DELAYED_RELEASE_TABLET | Freq: Every day | ORAL | Status: DC
Start: 2014-05-01 — End: 2014-05-04
  Administered 2014-05-01 – 2014-05-04 (×4): 40 mg via ORAL
  Filled 2014-05-01 (×4): qty 1

## 2014-05-01 MED ORDER — ENSURE COMPLETE PO LIQD
237.0000 mL | Freq: Two times a day (BID) | ORAL | Status: DC
  Administered 2014-05-01 (×2): 237 mL via ORAL

## 2014-05-01 MED ORDER — ACETAMINOPHEN 325 MG PO TABS
325.0000 mg | ORAL_TABLET | Freq: Four times a day (QID) | ORAL | Status: DC | PRN
  Administered 2014-05-01 – 2014-05-02 (×3): 325 mg via ORAL
  Filled 2014-05-01 (×3): qty 1

## 2014-05-01 MED ORDER — POTASSIUM CHLORIDE CRYS ER 20 MEQ PO TBCR
40.0000 meq | EXTENDED_RELEASE_TABLET | Freq: Once | ORAL | Status: AC
  Administered 2014-05-01: 40 meq via ORAL
  Filled 2014-05-01: qty 2

## 2014-05-01 MED ORDER — SODIUM CHLORIDE 0.9 % IJ SOLN
3.0000 mL | Freq: Two times a day (BID) | INTRAMUSCULAR | Status: DC
  Administered 2014-05-01 – 2014-05-04 (×5): 3 mL via INTRAVENOUS

## 2014-05-01 MED ORDER — CALCIUM CARBONATE ANTACID 500 MG PO CHEW
2.0000 | CHEWABLE_TABLET | Freq: Three times a day (TID) | ORAL | Status: DC | PRN
  Filled 2014-05-01: qty 2

## 2014-05-01 MED ORDER — SODIUM CHLORIDE 0.9 % IV SOLN
INTRAVENOUS | Status: DC
  Administered 2014-05-01: 01:00:00 via INTRAVENOUS
  Administered 2014-05-01: 1000 mL via INTRAVENOUS
  Administered 2014-05-02: 04:00:00 via INTRAVENOUS

## 2014-05-01 MED ORDER — HEPARIN SODIUM (PORCINE) 5000 UNIT/ML IJ SOLN
5000.0000 [IU] | Freq: Three times a day (TID) | INTRAMUSCULAR | Status: DC
  Administered 2014-05-01 – 2014-05-04 (×11): 5000 [IU] via SUBCUTANEOUS
  Filled 2014-05-01 (×13): qty 1

## 2014-05-01 NOTE — Progress Notes (Signed)
Clinical Social Work Department BRIEF PSYCHOSOCIAL ASSESSMENT 05/01/2014  Patient:  Sheryl Shaw     Account Number:  [id number]     Admit date:  04/30/2014  Clinical Social Worker:  Maryln Manuel  Date/Time:  05/01/2014 02:53 PM  Referred by:  Physician  Date Referred:  05/01/2014 Referred for  ALF Placement   Other Referral:   Interview type:  Family Other interview type:    PSYCHOSOCIAL DATA Living Status:  FACILITY Admitted from facility:  [facility] Level of care:  Assisted Living Primary support name:  Sheryl Shaw/daughter/[phone] Primary support relationship to patient:  CHILD, ADULT Degree of support available:   strong    CURRENT CONCERNS Current Concerns  Post-Acute Placement   Other Concerns:    SOCIAL WORK ASSESSMENT / PLAN CSW received referral that pt admitted from [facility].    CSW met with pt and pt daughter, Sheryl Siad at bedside. CSW introduced self and explained role. Pt shared that she is very HOH, but agreeable to discussing with pt daughter. Pt daughter confirmed that pt is a resident at [facility]. Pt daughter shared that pt has been at facility since June 2014. Pt daughter discussed tht pt has been very satisfied with the facility and has a private room there. Pt and pt daughter wish for pt to return to [facility] upon discharge. CSW discussed that PT has been ordered and will evaluate pt. Pt daughter stated that pt has had [place] [facility] at ALF in the past and she anticipates pt will need therapy again. Pt daughter stated that pt walks with a walker at ALF.    CSW contacted [facility]. Per Network engineer at facility, facility RN currently in staff meeting. CSW provided facility secretary CSW contact information to provide to RN in order for RN to return this [place] phone call.    CSW to continue to follow to provide support and assist with pt disposition needs.   Assessment/plan  status:  Psychosocial Support/Ongoing Assessment of Needs Other assessment/ plan:   discharge planning   Information/referral to community resources:   Referral back to [facility]    PATIENT'S/FAMILY'S RESPONSE TO PLAN OF CARE: Pt alert and oriented x 2. Pt hard of hearing. Pt daughter supportive and actively involved in pt care. Pt daughter wishes for pt to return to [facility] when medically stable.    Sheryl Shaw, MSW, [place] Work [phone]

## 2014-05-01 NOTE — H&P (Signed)
[facility] History and Physical  Sheryl Shaw BSJ:[id number] DOB: Mar 22, 1925 DOA: 04/30/2014  Referring physician: EDP PCP: Lujean Amel, MD   Chief Complaint: Fever, AMS, fatigue   HPI: Sheryl Shaw is a 81 y.o. female brought here by EMS from SNF where she resides for her chronic dementia.  According to family members patient has had increased confusion, warm to touch, N/V/D, one bout of NBNB emesis today at lunch.  She has low back pain which family thinks is pretty chronic.  There was some mention of foul smelling urine at the NH as well.  Remainder of ROS negative.  Review of Systems: No headache, no skin changes, no CP, no SOB, very occasional cough, no dysuria, no abdominal pain.  Systems reviewed.  As above, otherwise negative  Past Medical History  Diagnosis Date  . Hypertension   . GERD (gastroesophageal reflux disease)   . Palpitations   . Chronic fatigue   . Syncope 2007  . Vertebrobasilar TIAs   . Optic nerve disorder     right  . Nausea   . Arteriosclerotic heart disease    Past Surgical History  Procedure Laterality Date  . Rectal polypectomy    . Cataract extraction, bilateral    . Transthoracic echocardiogram  04/02/2005    EF 50-60%  . Esophageal dilitation     Social History:  reports that she has never smoked. She has never used smokeless tobacco. She reports that she does not drink alcohol or use illicit drugs.  Allergies  Allergen Reactions  . Codeine Other (See Comments)    Unknown (on MAR)    Family History  Problem Relation Age of Onset  . Heart disease Father   . Heart attack Father   . Heart disease Brother   . Heart disease Brother   . Heart disease Brother   . Heart disease Brother   . Heart disease Brother   . Heart attack Brother   . Heart attack Brother   . Heart attack Brother      Prior to Admission medications   Medication Sig Start Date End Date Taking? Authorizing Provider  acetaminophen (TYLENOL) 325 MG tablet  Take 325 mg by mouth every 6 (six) hours as needed (pain).   Yes Historical Provider, MD  atenolol (TENORMIN) 50 MG tablet Take 50 mg by mouth daily.   Yes Historical Provider, MD  calcium carbonate (TUMS - DOSED IN MG ELEMENTAL CALCIUM) 500 MG chewable tablet Chew 2 tablets by mouth every 8 (eight) hours as needed for indigestion or heartburn (reflux).   Yes Historical Provider, MD  clopidogrel (PLAVIX) 75 MG tablet Take 1 tablet (75 mg total) by mouth daily. 03/11/12  Yes Peter M Martinique, MD  furosemide (LASIX) 20 MG tablet Take 10 mg by mouth daily.   Yes Historical Provider, MD  lisinopril (PRINIVIL,ZESTRIL) 10 MG tablet Take 10 mg by mouth daily.    Yes Historical Provider, MD  loperamide (IMODIUM A-D) 2 MG tablet Take 2 mg by mouth as needed for diarrhea or loose stools.   Yes Historical Provider, MD  NON FORMULARY Take 1 each by mouth 2 (two) times daily with breakfast and lunch. "house shake"   Yes Historical Provider, MD  omeprazole (PRILOSEC) 20 MG capsule Take 20 mg by mouth daily.   Yes Historical Provider, MD  potassium chloride (K-DUR) 10 MEQ tablet Take 10 mEq by mouth daily.   Yes Historical Provider, MD  risperiDONE (RISPERDAL) 0.25 MG tablet Take 0.25 mg by mouth daily.  Yes Historical Provider, MD   Physical Exam: Filed Vitals:   05/01/14 0018  BP: 102/45  Pulse: 64  Temp:   Resp: 19    BP 102/45 mmHg  Pulse 64  Temp(Src) 100.3 F (37.9 C) (Rectal)  Resp 19  Ht 5\' 7"  (1.702 m)  Wt 52.164 kg (115 lb)  BMI 18.01 kg/m2  SpO2 98%  General Appearance:    Alert, oriented to self and place, no distress, appears stated age  Head:    Normocephalic, atraumatic  Eyes:    PERRL, EOMI, sclera non-icteric        Nose:   Nares without drainage or epistaxis. Mucosa, turbinates normal  Throat:   Moist mucous membranes. Oropharynx without erythema or exudate.  Neck:   Supple. No carotid bruits.  No thyromegaly.  No lymphadenopathy.   Back:     No CVA tenderness, no spinal  tenderness  Lungs:     Clear to auscultation bilaterally, without wheezes, rhonchi or rales  Chest wall:    No tenderness to palpitation  Heart:    Regular rate and rhythm without murmurs, gallops, rubs  Abdomen:     Soft, non-tender, nondistended, normal bowel sounds, no organomegaly  Genitalia:    deferred  Rectal:    deferred  Extremities:   No clubbing, cyanosis or edema.  Pulses:   2+ and symmetric all extremities  Skin:   Skin color, texture, turgor normal, no rashes or lesions  Lymph nodes:   Cervical, supraclavicular, and axillary nodes normal  Neurologic:   CNII-XII intact. Normal strength, sensation and reflexes      throughout    Labs on Admission:  Basic Metabolic Panel:  Recent Labs Lab 04/30/14 2153  NA 140  K 3.4*  CL 102  GLUCOSE 127*  BUN 15  CREATININE 0.70   Liver Function Tests:  Recent Labs Lab 04/30/14 2132  AST 26  ALT 22  ALKPHOS 92  BILITOT 0.7  PROT 6.6  ALBUMIN 3.2*   No results for input(s): LIPASE, AMYLASE in the last 168 hours. No results for input(s): AMMONIA in the last 168 hours. CBC:  Recent Labs Lab 04/30/14 2132 04/30/14 2153  WBC 17.2*  --   NEUTROABS 13.8*  --   HGB 11.0* 12.2  HCT 33.1* 36.0  MCV 90.7  --   PLT 249  --    Cardiac Enzymes: No results for input(s): CKTOTAL, CKMB, CKMBINDEX, TROPONINI in the last 168 hours.  BNP (last 3 results) No results for input(s): PROBNP in the last 8760 hours. CBG:  Recent Labs Lab 04/30/14 2133  GLUCAP 120*    Radiological Exams on Admission: Ct Head Wo Contrast  04/30/2014   CLINICAL DATA:  Acute onset of altered mental status. Worsening confusion. Hypotension. Initial encounter.  EXAM: CT HEAD WITHOUT CONTRAST  TECHNIQUE: Contiguous axial images were obtained from the base of the skull through the vertex without intravenous contrast.  COMPARISON:  CT of the head performed 09/06/2013  FINDINGS: There is no evidence of acute infarction, mass lesion, or intra- or  extra-axial hemorrhage on CT.  Prominence of the ventricles and sulci reflects moderate cortical volume loss. Scattered periventricular and subcortical white matter change likely reflects small vessel ischemic microangiopathy. Cerebellar atrophy is noted. A likely chronic lacunar infarct is noted at the brainstem.  The fourth ventricle is within normal limits. The basal ganglia are unremarkable in appearance. The cerebral hemispheres demonstrate grossly normal gray-white differentiation. No mass effect or midline shift is seen.  There is no evidence of fracture; visualized osseous structures are unremarkable in appearance. The orbits are within normal limits. The paranasal sinuses and mastoid air cells are well-aerated. No significant soft tissue abnormalities are seen.  IMPRESSION: 1. No acute intracranial pathology seen on CT. 2. Moderate cortical volume loss and scattered small vessel ischemic microangiopathy. Chronic lacunar infarct suggested at the brainstem.   Electronically Signed   By: Garald Balding M.D.   On: 04/30/2014 22:52   Dg Chest Portable 1 View  04/30/2014   CLINICAL DATA:  Fatigue, fever, altered mental status. Symptoms for 1-3 days.  EXAM: PORTABLE CHEST - 1 VIEW  COMPARISON:  Chest CT 07/23/2012  FINDINGS: The heart is at the upper limits of normal in size. There is atherosclerosis of the thoracic aorta. Lung volumes are low, no airspace consolidation to suggest pneumonia. There is no large pleural effusion or pneumothorax. No pulmonary edema. No acute osseous abnormalities are seen, there is degenerative change in both shoulders.  IMPRESSION: No acute pulmonary process.   Electronically Signed   By: Jeb Levering M.D.   On: 04/30/2014 21:41    EKG: Independently reviewed.  Assessment/Plan Principal Problem:   Sepsis associated hypotension Active Problems:   HTN (hypertension)   SIRS (systemic inflammatory response syndrome)   Nausea vomiting and diarrhea   1. Sepsis  associated hypotension and SIRS - unclear source 1. CT abd/pelvis w/o contrast 2. Empiric zosyn and vanc, patient is from SNF, also feel that we may only have one chance to get coverage right on this patient given the fact that her BPs got as low as 80s/30s in the ED. 3. BP is thankfully responsive to IVF at this point, just got L bolus, continuous infusion at 125 cc/hr, bolus as needed 4. Hold all home BP meds 5. Blood cultures pending 6. UA and CXR negative 7. Mild elevation in lactate but patient does not appear toxic at bedside right now 8. Repeat labs in AM 2. N/V/D - possibly the source of infection, CT abd/pelvis pending, abdominal exam is benign, will also check lipase.    Code Status: DNR - confirmed with family at bedside Family Communication: Family at bedside Disposition Plan: Admit to inpatient   Time spent: 70 min  Kristien Salatino M. [facility] Pager [phone]  If 7AM-7PM, please contact the day team taking care of the patient [email or web address] Password [facility] 05/01/2014, 12:45 AM

## 2014-05-01 NOTE — Progress Notes (Signed)
PT Cancellation Note  Patient Details Name: Elverta Dimiceli MRN: [id number] DOB: 07-09-1924   Cancelled Treatment:    Reason Eval/Treat Not Completed: Patient declined,  (states she was just up and doesn't want to get up again.)Nsg reports pt is getting up to [facility]. Return in AM.  Claretha Cooper 05/01/2014, 3:29 PM Tresa Endo PT [phone]

## 2014-05-01 NOTE — Progress Notes (Signed)
INITIAL NUTRITION ASSESSMENT  DOCUMENTATION CODES Per approved criteria  -Severe malnutrition in the context of chronic illness  Pt meets criteria for severe MALNUTRITION in the context of chronic illness as evidenced by severe fat and muscle depletion.  INTERVENTION: -Provide Carnation Instant Breakfast BID instead of Ensure, provides 220 kcal and 13g of protein -Provide Magic cup BID with meals, each supplement provides 290 kcal and 9 grams of protein -Encourage PO intake  NUTRITION DIAGNOSIS: Malnutrition related to advanced age as evidenced by severe fat and muscle depletion.   Goal: Pt to meet >/= 90% of their estimated nutrition needs   Monitor:  PO and supplemental intake, weight, labs, I/O's  Reason for Assessment: Pt identified as at nutrition risk on the Malnutrition Screen Tool  Admitting Dx: Sepsis associated hypotension  ASSESSMENT: 81 y.o. female brought here by EMS from SNF where she resides for her chronic dementia. According to family members patient has had increased confusion, warm to touch, N/V/D, one bout of NBNB emesis today at lunch.  Pt and pt's daughter in room during visit. Per daughter, pt's appetite has been good and hasn't changed. Pt was eating lunch during visit, side salad and soup.  PO intake: 75%  Pt receives "healthshakes" at facility where she lives. Pt does not like Ensure or Boost drinks. Pt is willing to try vanilla [employer] drinks. Pt likes ice cream, RD will order magic cups with lunch/dinner meals.  Nutrition Focused Physical Exam:  Subcutaneous Fat:  Orbital Region: WNL Upper Arm Region: severe depletion Thoracic and Lumbar Region: NA  Muscle:  Temple Region: moderate depletion Clavicle Bone Region: moderate depletion Clavicle and Acromion Bone Region: moderate depletion Scapular Bone Region: NA Dorsal Hand: severe depletion Patellar Region: NA Anterior Thigh Region: NA Posterior Calf Region: NA  Edema:  non-pitting RLE and LLE edema  Labs reviewed: Low K  Height: Ht Readings from Last 1 Encounters:  05/01/14 5\' 7"  (1.702 m)    Weight: Wt Readings from Last 1 Encounters:  05/01/14 120 lb 1.6 oz (54.477 kg)    Ideal Body Weight: 135 lb  % Ideal Body Weight: 89%  Wt Readings from Last 10 Encounters:  05/01/14 120 lb 1.6 oz (54.477 kg)  10/07/11 138 lb (62.596 kg)  04/02/11 131 lb (59.421 kg)  10/29/10 141 lb 12.8 oz (64.32 kg)  08/29/10 153 lb 6 oz (69.57 kg)    Usual Body Weight: 186 lb -per pt  % Usual Body Weight: 65%  BMI:  Body mass index is 18.81 kg/(m^2).  Estimated Nutritional Needs: Kcal: 1650-1850 Protein: 65-75g Fluid: 1.7L/day  Skin: intact, ecchymosis  Diet Order: Diet Heart  EDUCATION NEEDS: -No education needs identified at this time   Intake/Output Summary (Last 24 hours) at 05/01/14 1015 Last data filed at 05/01/14 0112  Gross per 24 hour  Intake   2250 ml  Output    300 ml  Net   1950 ml    Last BM: PTA  Labs:   Recent Labs Lab 04/30/14 2153 05/01/14 0424  NA 140 141  K 3.4* 3.3*  CL 102 107  CO2  --  26  BUN 15 16  CREATININE 0.70 0.70  CALCIUM  --  8.1*  GLUCOSE 127* 97    CBG (last 3)   Recent Labs  04/30/14 2133  GLUCAP 120*    Scheduled Meds: . clopidogrel  75 mg Oral Daily  . feeding supplement (ENSURE COMPLETE)  237 mL Oral BID BM  . heparin  5,000 Units Subcutaneous 3 times per day  . pantoprazole  40 mg Oral Daily  . piperacillin-tazobactam (ZOSYN)  IV  3.375 g Intravenous Q8H  . risperiDONE  0.25 mg Oral Daily  . sodium chloride  3 mL Intravenous Q12H  . vancomycin  750 mg Intravenous Q24H    Continuous Infusions: . sodium chloride 125 mL/hr at 05/01/14 0127    Past Medical History  Diagnosis Date  . Hypertension   . GERD (gastroesophageal reflux disease)   . Palpitations   . Chronic fatigue   . Syncope 2007  . Vertebrobasilar TIAs   . Optic nerve disorder     right  . Nausea   .  Arteriosclerotic heart disease     Past Surgical History  Procedure Laterality Date  . Rectal polypectomy    . Cataract extraction, bilateral    . Transthoracic echocardiogram  04/02/2005    EF 50-60%  . Esophageal dilitation      Clayton Bibles, MS, RD, LDN Pager: [phone] After Hours Pager: [phone]

## 2014-05-01 NOTE — ED Notes (Signed)
MD at bedside. [facility] hospitalist

## 2014-05-01 NOTE — Care Management Note (Signed)
CARE MANAGEMENT NOTE 05/01/2014  Patient:  [facility]   Account Number:  [id number]  Date Initiated:  05/01/2014  Documentation initiated by:  Marney Doctor  Subjective/Objective Assessment:   81 yo admitted with Sepsis     Action/Plan:   Pt from [place] [facility]   Anticipated DC Date:  05/03/2014   Anticipated DC Plan:  ASSISTED LIVING / [place] referral  Clinical Social Worker      DC Planning Services  CM consult      Choice offered to / List presented to:             Status of service:  In process, will continue to follow Medicare Important Message given?   (If response is "NO", the following Medicare IM given date fields will be blank) Date Medicare IM given:   Medicare IM given by:   Date Additional Medicare IM given:   Additional Medicare IM given by:    Discharge Disposition:    Per UR Regulation:  Reviewed for med. necessity/level of care/duration of stay  If discussed at [place] of Stay Meetings, dates discussed:    Comments:  05/01/14 Marney Doctor RN,BSN,NCM PT to evaluate pt.  Potentially could need HHPT at ALF. Will await recommendations.

## 2014-05-01 NOTE — Progress Notes (Signed)
[facility] PROGRESS NOTE  Sheryl Shaw RJJ:[id number] DOB: 19-Dec-1924 DOA: 04/30/2014 PCP: Lujean Amel, MD  Assessment/Plan: 1. Fever- patient presented with fever 101.4 with white count of 17.2. Started empirically on vancomycin and Zosyn. The patient is improving, source of infection is not clear at this time. We'll await the blood culture results and continue the empiric antibiotics. UA is clear, chest x-ray did not show pneumonia, CT abdomen pelvis showed no significant pathology to explain patient's fever and white count. 2. Dementia- patient has history of dementia, currently she is not displaying any behavior disturbance. Continue risperidone 0.25 mg daily. Patient's mental status is close to her baseline. 3. History of vertebrobasilar TIAs- continue Plavix 4. Nausea and vomiting- ? Cause, resolved, CT abdomen is negative for acute pathology, continue Zofran when necessary. Liver enzymes within normal range. 5. Hypokalemia- replace potassium and check BMP in a.m. 6. DVT prophylaxis- heparin  Code Status: DO NOT RESUSCITATE Family Communication: *Discussed with daughter at bedside Disposition Plan: Skilled nursing facility   Consultants:  None  Procedures:  None  Antibiotics:  Vancomycin 04/30/14  Zosyn 04/30/14  HPI/Subjective: 81 year old female with a history of dementia who was brought to the hospital for worsening confusion. Had one episode of vomiting yesterday. Also patient has been febrile MAXIMUM TEMPERATURE was 101.4 yesterday. UA was clear, chest x-ray did not show pneumonia, she underwent CT abdomen pelvis which did not show any significant pathology. Currently patient is afebrile, white count has come down from 17,000-15,000.  Patient denies any symptoms, wants to go back to the skilled facility. Daughter at bedside  Objective: Filed Vitals:   05/01/14 0542  BP: 104/50  Pulse: 65  Temp: 98.4 F (36.9 C)  Resp: 16    Intake/Output Summary (Last 24  hours) at 05/01/14 1152 Last data filed at 05/01/14 0915  Gross per 24 hour  Intake   2370 ml  Output    300 ml  Net   2070 ml   Filed Weights   04/30/14 2203 05/01/14 0152  Weight: 52.164 kg (115 lb) 54.477 kg (120 lb 1.6 oz)    Exam:  Physical Exam: Eyes: No icterus, extraocular muscles intact  Mouth: Oral mucosa is moist, no lesions on palate,  Neck: Supple, no deformities, masses, or tenderness Lungs: Normal respiratory effort, bilateral clear to auscultation, no crackles or wheezes.  Heart: Regular rate and rhythm, S1 and S2 normal, no murmurs, rubs auscultated Abdomen: BS normoactive,soft,nondistended,non-tender to palpation,no organomegaly Extremities: No pretibial edema, no erythema, no cyanosis, no clubbing. Right lower extremity warm to touch as compared to left Neuro : Alert but not oriented  3   Data Reviewed: Basic Metabolic Panel:  Recent Labs Lab 04/30/14 2153 05/01/14 0424  NA 140 141  K 3.4* 3.3*  CL 102 107  CO2  --  26  GLUCOSE 127* 97  BUN 15 16  CREATININE 0.70 0.70  CALCIUM  --  8.1*   Liver Function Tests:  Recent Labs Lab 04/30/14 2132  AST 26  ALT 22  ALKPHOS 92  BILITOT 0.7  PROT 6.6  ALBUMIN 3.2*    Recent Labs Lab 05/01/14 0424  LIPASE 20   No results for input(s): AMMONIA in the last 168 hours. CBC:  Recent Labs Lab 04/30/14 2132 04/30/14 2153 05/01/14 0424  WBC 17.2*  --  15.8*  NEUTROABS 13.8*  --   --   HGB 11.0* 12.2 10.1*  HCT 33.1* 36.0 30.9*  MCV 90.7  --  91.4  PLT 249  --  237   Cardiac Enzymes: No results for input(s): CKTOTAL, CKMB, CKMBINDEX, TROPONINI in the last 168 hours. BNP (last 3 results) No results for input(s): BNP in the last 8760 hours.  ProBNP (last 3 results) No results for input(s): PROBNP in the last 8760 hours.  CBG:  Recent Labs Lab 04/30/14 2133  GLUCAP 120*    Recent Results (from the past 240 hour(s))  MRSA PCR Screening     Status: None   Collection Time: 05/01/14   2:30 AM  Result Value Ref Range Status   MRSA by PCR NEGATIVE NEGATIVE Final    Comment:        The GeneXpert MRSA Assay (FDA approved for NASAL specimens only), is one component of a comprehensive MRSA colonization surveillance program. It is not intended to diagnose MRSA infection nor to guide or monitor treatment for MRSA infections.      Studies: Ct Abdomen Pelvis Wo Contrast  05/01/2014   CLINICAL DATA:  Acute onset of fatigue and low grade fever. Elevated lactic acid and leukocytosis. Initial encounter.  EXAM: CT ABDOMEN AND PELVIS WITHOUT CONTRAST  TECHNIQUE: Multidetector CT imaging of the abdomen and pelvis was performed following the standard protocol without IV contrast.  COMPARISON:  MRI of the pelvis performed 08/22/2004, and abdominal ultrasound performed 11/11/2011  FINDINGS: Increased interstitial markings at the lung bases may be chronic in nature, with mild superimposed atelectasis. Scattered coronary artery calcifications are seen. The heart is mildly enlarged.  The liver and spleen are unremarkable in appearance. The patient is status post cholecystectomy. An apparent 6 mm retained stone is incidentally seen. Prominence of the common hepatic duct to 1.4 cm may simply reflect prior cholecystectomy, as there is no definite evidence of distal obstruction. The pancreas and adrenal glands are unremarkable.  The kidneys are unremarkable in appearance. There is no evidence of hydronephrosis. No renal or ureteral stones are seen. No perinephric stranding is appreciated.  No free fluid is identified. The small bowel is unremarkable in appearance. The stomach is within normal limits. No acute vascular abnormalities are seen. Scattered calcification is seen along the abdominal aorta and its branches. The extent of calcification at the proximal superior mesenteric artery raises concern for moderate to severe stenosis.  There is minimal haziness within the mesentery, without evidence of  significant edema to suggest mesenteric ischemia.  The appendix is normal in caliber, without evidence for appendicitis. The colon is partially filled with stool and is unremarkable in appearance.  The bladder is moderately distended and grossly unremarkable. The uterus is within normal limits. The ovaries are grossly symmetric. No suspicious adnexal masses are seen. No inguinal lymphadenopathy is seen.  No acute osseous abnormalities are identified. Vacuum phenomenon is noted along the lower thoracic and lumbar spine, with associated calcified disc protrusions along the lower lumbar spine. There is chronic loss of height at vertebral bodies T12 and L1.  IMPRESSION: 1. No acute abnormality seen to explain the patient's symptoms. 2. Calcification along the proximal superior mesenteric artery raises concern for moderate to severe stenosis. However, there is no evidence of significant mesenteric edema at this time to suggest mesenteric ischemia. The visualized bowel is grossly unremarkable, with minimal nonspecific haziness in the mesentery. 3. Scattered calcification along the abdominal aorta and its branches. 4. Increased interstitial markings at the lung bases may be chronic in nature, with mild superimposed atelectasis. 5. Scattered coronary artery calcifications seen. 6. Mild cardiomegaly. 7. Apparent 6 mm retained stone incidentally noted at the gallbladder fossa.  Electronically Signed   By: Garald Balding M.D.   On: 05/01/2014 01:42   Ct Head Wo Contrast  04/30/2014   CLINICAL DATA:  Acute onset of altered mental status. Worsening confusion. Hypotension. Initial encounter.  EXAM: CT HEAD WITHOUT CONTRAST  TECHNIQUE: Contiguous axial images were obtained from the base of the skull through the vertex without intravenous contrast.  COMPARISON:  CT of the head performed 09/06/2013  FINDINGS: There is no evidence of acute infarction, mass lesion, or intra- or extra-axial hemorrhage on CT.  Prominence of the  ventricles and sulci reflects moderate cortical volume loss. Scattered periventricular and subcortical white matter change likely reflects small vessel ischemic microangiopathy. Cerebellar atrophy is noted. A likely chronic lacunar infarct is noted at the brainstem.  The fourth ventricle is within normal limits. The basal ganglia are unremarkable in appearance. The cerebral hemispheres demonstrate grossly normal gray-white differentiation. No mass effect or midline shift is seen.  There is no evidence of fracture; visualized osseous structures are unremarkable in appearance. The orbits are within normal limits. The paranasal sinuses and mastoid air cells are well-aerated. No significant soft tissue abnormalities are seen.  IMPRESSION: 1. No acute intracranial pathology seen on CT. 2. Moderate cortical volume loss and scattered small vessel ischemic microangiopathy. Chronic lacunar infarct suggested at the brainstem.   Electronically Signed   By: Garald Balding M.D.   On: 04/30/2014 22:52   Dg Chest Portable 1 View  04/30/2014   CLINICAL DATA:  Fatigue, fever, altered mental status. Symptoms for 1-3 days.  EXAM: PORTABLE CHEST - 1 VIEW  COMPARISON:  Chest CT 07/23/2012  FINDINGS: The heart is at the upper limits of normal in size. There is atherosclerosis of the thoracic aorta. Lung volumes are low, no airspace consolidation to suggest pneumonia. There is no large pleural effusion or pneumothorax. No pulmonary edema. No acute osseous abnormalities are seen, there is degenerative change in both shoulders.  IMPRESSION: No acute pulmonary process.   Electronically Signed   By: Jeb Levering M.D.   On: 04/30/2014 21:41    Scheduled Meds: . clopidogrel  75 mg Oral Daily  . feeding supplement (ENSURE COMPLETE)  237 mL Oral BID BM  . heparin  5,000 Units Subcutaneous 3 times per day  . pantoprazole  40 mg Oral Daily  . piperacillin-tazobactam (ZOSYN)  IV  3.375 g Intravenous Q8H  . potassium chloride  40 mEq  Oral Once  . risperiDONE  0.25 mg Oral Daily  . sodium chloride  3 mL Intravenous Q12H  . vancomycin  750 mg Intravenous Q24H   Continuous Infusions: . sodium chloride 125 mL/hr at 05/01/14 0127    Principal Problem:   Sepsis associated hypotension Active Problems:   HTN (hypertension)   SIRS (systemic inflammatory response syndrome)   Nausea vomiting and diarrhea    Time spent: *25 min    [facility]  [facility] Pager [phone]*. If 7PM-7AM, please contact night-coverage at [email or web address], password [email or web address] 05/01/2014, 11:52 AM  LOS: 1 day

## 2014-05-02 DIAGNOSIS — E43 Unspecified severe protein-calorie malnutrition: Secondary | ICD-10-CM

## 2014-05-02 LAB — BASIC METABOLIC PANEL
Anion gap: 8 (ref 5–15)
BUN: 14 mg/dL (ref 6–23)
CO2: 23 mmol/L (ref 19–32)
Calcium: 8.2 mg/dL — ABNORMAL LOW (ref 8.4–10.5)
Chloride: 112 mmol/L (ref 96–112)
Creatinine, Ser: 0.74 mg/dL (ref 0.50–1.10)
GFR calc Af Amer: 86 mL/min — ABNORMAL LOW (ref >90)
GFR, EST NON AFRICAN AMERICAN: 74 mL/min — AB (ref >90)
Glucose, Bld: 94 mg/dL (ref 70–99)
POTASSIUM: 3.6 mmol/L (ref 3.5–5.1)
SODIUM: 143 mmol/L (ref 135–145)

## 2014-05-02 LAB — CLOSTRIDIUM DIFFICILE BY PCR: Toxigenic C. Difficile by PCR: NEGATIVE

## 2014-05-02 LAB — CBC
HEMATOCRIT: 30.4 % — AB (ref 36.0–46.0)
Hemoglobin: 10 g/dL — ABNORMAL LOW (ref 12.0–15.0)
MCH: 30.3 pg (ref 26.0–34.0)
MCHC: 32.9 g/dL (ref 30.0–36.0)
MCV: 92.1 fL (ref 78.0–100.0)
PLATELETS: 245 10*3/uL (ref 150–400)
RBC: 3.3 MIL/uL — ABNORMAL LOW (ref 3.87–5.11)
RDW: 14.7 % (ref 11.5–15.5)
WBC: 8.8 10*3/uL (ref 4.0–10.5)

## 2014-05-02 MED ORDER — ATENOLOL 50 MG PO TABS
50.0000 mg | ORAL_TABLET | Freq: Every day | ORAL | Status: DC
  Administered 2014-05-02 – 2014-05-04 (×3): 50 mg via ORAL
  Filled 2014-05-02 (×3): qty 1

## 2014-05-02 MED ORDER — LISINOPRIL 10 MG PO TABS
10.0000 mg | ORAL_TABLET | Freq: Every day | ORAL | Status: DC
  Administered 2014-05-02 – 2014-05-04 (×3): 10 mg via ORAL
  Filled 2014-05-02 (×3): qty 1

## 2014-05-02 NOTE — Progress Notes (Signed)
ANTIBIOTIC CONSULT NOTE - Follow-up  Pharmacy Consult for Vancomycin and Zosyn Indication: rule out sepsis  Allergies  Allergen Reactions  . Codeine Other (See Comments)    Unknown (on MAR)    Patient Measurements: Height: 5\' 7"  (170.2 cm) Weight: 120 lb 1.6 oz (54.477 kg) IBW/kg (Calculated) : 61.6  Vital Signs: Temp: 97.3 F (36.3 C) (02/03 0535) Temp Source: Oral (02/03 0535) BP: 130/56 mmHg (02/03 0535) Pulse Rate: 67 (02/03 0535) Intake/Output from previous day: 02/02 0701 - 02/03 0700 In: 3090.8 [P.O.:120; I.V.:2670.8; IV Piggyback:300] Out: 200 [Sheryl:200] Intake/Output from this shift:    Labs:  Recent Labs  04/30/14 2132 04/30/14 2153 05/01/14 0424 05/02/14 0430  WBC 17.2*  --  15.8* 8.8  HGB 11.0* 12.2 10.1* 10.0*  PLT 249  --  237 245  CREATININE  --  0.70 0.70 0.74   Estimated Creatinine Clearance: 42.6 mL/min (by C-G formula based on Cr of 0.74). No results for input(s): VANCOTROUGH, VANCOPEAK, VANCORANDOM, GENTTROUGH, GENTPEAK, GENTRANDOM, TOBRATROUGH, TOBRAPEAK, TOBRARND, AMIKACINPEAK, AMIKACINTROU, AMIKACIN in the last 72 hours.   Microbiology: Recent Results (from the past 720 hour(s))  Sheryl culture     Status: None   Collection Time: 04/30/14  9:29 PM  Result Value Ref Range Status   Specimen Description Sheryl, CATHETERIZED  Final   Special Requests NONE  Final   Colony Count NO GROWTH Performed at [facility]   Final   Culture NO GROWTH Performed at [facility]   Final   Report Status 05/01/2014 FINAL  Final  MRSA PCR Screening     Status: None   Collection Time: 05/01/14  2:30 AM  Result Value Ref Range Status   MRSA by PCR NEGATIVE NEGATIVE Final    Comment:        The GeneXpert MRSA Assay (FDA approved for NASAL specimens only), is one component of a comprehensive MRSA colonization surveillance program. It is not intended to diagnose MRSA infection nor to guide or monitor treatment for MRSA infections.    Clostridium Difficile by PCR     Status: None   Collection Time: 05/01/14  3:12 PM  Result Value Ref Range Status   C difficile by pcr NEGATIVE NEGATIVE Final    Comment: Performed at [place] History: Past Medical History  Diagnosis Date  . Hypertension   . GERD (gastroesophageal reflux disease)   . Palpitations   . Chronic fatigue   . Syncope 2007  . Vertebrobasilar TIAs   . Optic nerve disorder     right  . Nausea   . Arteriosclerotic heart disease     Medications:  Prescriptions prior to admission  Medication Sig Dispense Refill Last Dose  . acetaminophen (TYLENOL) 325 MG tablet Take 325 mg by mouth every 6 (six) hours as needed (pain).   unknown  . atenolol (TENORMIN) 50 MG tablet Take 50 mg by mouth daily.   04/30/2014 at 0800  . calcium carbonate (TUMS - DOSED IN MG ELEMENTAL CALCIUM) 500 MG chewable tablet Chew 2 tablets by mouth every 8 (eight) hours as needed for indigestion or heartburn (reflux).   unknown  . clopidogrel (PLAVIX) 75 MG tablet Take 1 tablet (75 mg total) by mouth daily. 30 tablet 6 04/30/2014 at 0800  . furosemide (LASIX) 20 MG tablet Take 10 mg by mouth daily.   04/30/2014 at 0800  . lisinopril (PRINIVIL,ZESTRIL) 10 MG tablet Take 10 mg by mouth daily.    04/30/2014 at 0800  .  loperamide (IMODIUM A-D) 2 MG tablet Take 2 mg by mouth as needed for diarrhea or loose stools.   unknown  . NON FORMULARY Take 1 each by mouth 2 (two) times daily with breakfast and lunch. "house shake"   04/30/2014 at 12noon  . omeprazole (PRILOSEC) 20 MG capsule Take 20 mg by mouth daily.   04/30/2014 at 0600  . potassium chloride (K-DUR) 10 MEQ tablet Take 10 mEq by mouth daily.   04/30/2014 at 0800  . risperiDONE (RISPERDAL) 0.25 MG tablet Take 0.25 mg by mouth daily.   04/30/2014 at 0800    Assessment: 81 yo female SNF resident with dementia brought to ED for evaulation of fever and AMS. Pharmacy is consulted to dose vancomycin and zosyn for presumed sepsis, Sheryl  likely source.  2/1 >> ceftriaxone x 1 2/1 >> Vancomycin >> 2/1 >> Zosyn >>  Tmax: Afebrile WBC: now WNL Renal: SCr 0.7, stable. CrCl 43 CG Lactate: 3.42 (2/1)  2/1 blood x 2: collected 2/1 Sheryl: NGF 2/2 CDiff: collected 2/2: MRSA PCR negative   Goal of Therapy:  Vancomycin trough level 15-20 mcg/ml  Zosyn dose per renal function  Plan:  Continue Vancomycin 750mg  IV q24h Continue Zosyn 3.375gm IV q8h (4hr extended infusions) Follow up renal function & cultures, clinical course, de-escalation  Ralene Bathe, PharmD, BCPS 05/02/2014, 8:22 AM  Pager: [phone]

## 2014-05-02 NOTE — Evaluation (Signed)
Clinical/Bedside Swallow Evaluation Patient Details  Name: Sheryl Shaw MRN: [id number] Date of Birth: May 12, 1924  Today's Date: 05/02/2014 Time: SLP Start Time (ACUTE ONLY): 1508 SLP Stop Time (ACUTE ONLY): 1541 SLP Time Calculation (min) (ACUTE ONLY): 33 min  Past Medical History:  Past Medical History  Diagnosis Date  . Hypertension   . GERD (gastroesophageal reflux disease)   . Palpitations   . Chronic fatigue   . Syncope 2007  . Vertebrobasilar TIAs   . Optic nerve disorder     right  . Nausea   . Arteriosclerotic heart disease    Past Surgical History:  Past Surgical History  Procedure Laterality Date  . Rectal polypectomy    . Cataract extraction, bilateral    . Transthoracic echocardiogram  04/02/2005    EF 50-60%  . Esophageal dilitation     HPI:  Sheryl Shaw is a 81 y.o. female brought here by EMS from SNF where she resides for her chronic dementia. Pt with AMS and unclear source of infection - CXR without acute infection.   Assessment / Plan / Recommendation Clinical Impression  Pt has frequent, delayed throat clearing throughout PO intake with all consistencies. She reports that she does this "all the time" and endorses a h/o 9 esophageal dilations, with esophageal component being a possible etiology. Pt consistently clears her throat when her vocal quality becomes wet, with voice returning to baseline each time. Given the above as well as no overt findings of acute infection on CXR, recommend to initiate Dys 3 diet and thin liquids with use of aspiration and esophageal precautions.    Aspiration Risk  Mild    Diet Recommendation Dysphagia 3 (Mechanical Soft);Thin liquid   Liquid Administration via: Cup;No straw Medication Administration: Whole meds with puree Supervision: Patient able to self feed;Intermittent supervision to cue for compensatory strategies Compensations: Slow rate;Small sips/bites Postural Changes and/or Swallow Maneuvers: Seated upright  90 degrees;Upright 30-60 min after meal    Other  Recommendations Oral Care Recommendations: Oral care BID   Follow Up Recommendations  Other (comment) (tba - none anticipated)    Frequency and Duration min 2x/week  1 week   Pertinent Vitals/Pain n/a    SLP Swallow Goals     Swallow Study Prior Functional Status       General HPI: Sheryl Shaw is a 81 y.o. female brought here by EMS from SNF where she resides for her chronic dementia. Pt with AMS and unclear source of infection - CXR without acute infection. Type of Study: Bedside swallow evaluation Previous Swallow Assessment: none in chart Diet Prior to this Study: NPO Temperature Spikes Noted: No Respiratory Status: Room air History of Recent Intubation: No Behavior/Cognition: Alert;Cooperative;Pleasant mood;Requires cueing Oral Cavity - Dentition: Adequate natural dentition Self-Feeding Abilities: Able to feed self Patient Positioning: Upright in bed Baseline Vocal Quality: Clear Volitional Cough: Strong    Oral/Motor/Sensory Function Overall Oral Motor/Sensory Function: Appears within functional limits for tasks assessed   Ice Chips Ice chips: Not tested   Thin Liquid Thin Liquid: Impaired Presentation: Cup;Self Fed;Straw Pharyngeal  Phase Impairments: Throat Clearing - Delayed    Nectar Thick Nectar Thick Liquid: Not tested   Honey Thick Honey Thick Liquid: Not tested   Puree Puree: Impaired Presentation: Self Fed;Spoon Pharyngeal Phase Impairments: Throat Clearing - Delayed   Solid   GO    Solid: Impaired Presentation: Self Fed Pharyngeal Phase Impairments: Throat Clearing - Delayed      Germain Osgood, M.A. CCC-SLP [phone]  Germain Osgood 05/02/2014,4:09  PM

## 2014-05-02 NOTE — Progress Notes (Signed)
PT Cancellation Note  Patient Details Name: Sheryl Shaw MRN: [id number] DOB: Dec 04, 1924   Cancelled Treatment:     PT eval attempted x 2 this date.  Pt refusing participation.  "I need to talk to Susie about it first"  "I am just hunger sick. They wont give me anything to eat or drink"  Pt is currently NPO.  Will follow and re-attempt in am.   Markos Theil 05/02/2014, 2:33 PM

## 2014-05-02 NOTE — H&P (Signed)
[facility] PROGRESS NOTE  Assessment/Plan: Sepsis associated hypotension: - Unclear source. - Started empirically on vancomycin and Zosyn on 05/01/2014. - Hypotension has resolved, she has remained afebrile leukocytosis improved. - Chest x-ray is clear, UA shows no signs of infection. C. difficile is negative. - Blood cultures are pending I am concerned about aspiration, get a swallowing evaluation. - KVO IV fluids.  Protein-calorie malnutrition, severe - Ensure 3 times a day.   Nausea vomiting and diarrhea: - Resolved, CT scan of the abdomen and pelvis done on admission shows no acute pathology.  - LFTs within normal.   Hypokalemia: - Replace  Essential  HTN (hypertension) - Resume home medications except Lasix.    Code Status: Full Family Communication: none  Disposition Plan: inpatient   Consultants: None Procedures:   CT abdomen and pelvis 2.1.2016  Antibiotics: Vancomycin and Zosyn  HPI/Subjective: No complains  Objective: Filed Vitals:   05/01/14 0152 05/01/14 0542 05/01/14 2051 05/02/14 0535  BP: 111/40 104/50 131/50 130/56  Pulse: 63 65 67 67  Temp: 98.2 F (36.8 C) 98.4 F (36.9 C) 97.3 F (36.3 C) 97.3 F (36.3 C)  TempSrc: Oral Oral Oral Oral  Resp: 16 16 16 18   Height: 5\' 7"  (1.702 m)     Weight: 54.477 kg (120 lb 1.6 oz)     SpO2: 100% 100% 100% 100%    Intake/Output Summary (Last 24 hours) at 05/02/14 0901 Last data filed at 05/02/14 0600  Gross per 24 hour  Intake 3090.83 ml  Output    200 ml  Net 2890.83 ml   Filed Weights   04/30/14 2203 05/01/14 0152  Weight: 52.164 kg (115 lb) 54.477 kg (120 lb 1.6 oz)    Exam:  General: Alert, awake, oriented x3, in no acute distress.  HEENT: No bruits, no goiter.  Heart: Regular rate and rhythm/ Lungs: Good air movement,[place] Abdomen: Soft, nontender, nondistended, positive bowel sounds.  Neuro: Grossly intact, nonfocal.   Data Reviewed: Basic Metabolic Panel:  Recent  Labs Lab 04/30/14 2153 05/01/14 0424 05/02/14 0430  NA 140 141 143  K 3.4* 3.3* 3.6  CL 102 107 112  CO2  --  26 23  GLUCOSE 127* 97 94  BUN 15 16 14   CREATININE 0.70 0.70 0.74  CALCIUM  --  8.1* 8.2*   Liver Function Tests:  Recent Labs Lab 04/30/14 2132  AST 26  ALT 22  ALKPHOS 92  BILITOT 0.7  PROT 6.6  ALBUMIN 3.2*    Recent Labs Lab 05/01/14 0424  LIPASE 20   No results for input(s): AMMONIA in the last 168 hours. CBC:  Recent Labs Lab 04/30/14 2132 04/30/14 2153 05/01/14 0424 05/02/14 0430  WBC 17.2*  --  15.8* 8.8  NEUTROABS 13.8*  --   --   --   HGB 11.0* 12.2 10.1* 10.0*  HCT 33.1* 36.0 30.9* 30.4*  MCV 90.7  --  91.4 92.1  PLT 249  --  237 245   Cardiac Enzymes: No results for input(s): CKTOTAL, CKMB, CKMBINDEX, TROPONINI in the last 168 hours. BNP (last 3 results) No results for input(s): BNP in the last 8760 hours.  ProBNP (last 3 results) No results for input(s): PROBNP in the last 8760 hours.  CBG:  Recent Labs Lab 04/30/14 2133  GLUCAP 120*    Recent Results (from the past 240 hour(s))  Urine culture     Status: None   Collection Time: 04/30/14  9:29 PM  Result Value Ref Range Status  Specimen Description URINE, CATHETERIZED  Final   Special Requests NONE  Final   Colony Count NO GROWTH Performed at [facility]   Final   Culture NO GROWTH Performed at [facility]   Final   Report Status 05/01/2014 FINAL  Final  MRSA PCR Screening     Status: None   Collection Time: 05/01/14  2:30 AM  Result Value Ref Range Status   MRSA by PCR NEGATIVE NEGATIVE Final    Comment:        The GeneXpert MRSA Assay (FDA approved for NASAL specimens only), is one component of a comprehensive MRSA colonization surveillance program. It is not intended to diagnose MRSA infection nor to guide or monitor treatment for MRSA infections.   Clostridium Difficile by PCR     Status: None   Collection Time: 05/01/14  3:12 PM    Result Value Ref Range Status   C difficile by pcr NEGATIVE NEGATIVE Final    Comment: Performed at [facility]     Studies: Ct Abdomen Pelvis Wo Contrast  05/01/2014   CLINICAL DATA:  Acute onset of fatigue and low grade fever. Elevated lactic acid and leukocytosis. Initial encounter.  EXAM: CT ABDOMEN AND PELVIS WITHOUT CONTRAST  TECHNIQUE: Multidetector CT imaging of the abdomen and pelvis was performed following the standard protocol without IV contrast.  COMPARISON:  MRI of the pelvis performed 08/22/2004, and abdominal ultrasound performed 11/11/2011  FINDINGS: Increased interstitial markings at the lung bases may be chronic in nature, with mild superimposed atelectasis. Scattered coronary artery calcifications are seen. The heart is mildly enlarged.  The liver and spleen are unremarkable in appearance. The patient is status post cholecystectomy. An apparent 6 mm retained stone is incidentally seen. Prominence of the common hepatic duct to 1.4 cm may simply reflect prior cholecystectomy, as there is no definite evidence of distal obstruction. The pancreas and adrenal glands are unremarkable.  The kidneys are unremarkable in appearance. There is no evidence of hydronephrosis. No renal or ureteral stones are seen. No perinephric stranding is appreciated.  No free fluid is identified. The small bowel is unremarkable in appearance. The stomach is within normal limits. No acute vascular abnormalities are seen. Scattered calcification is seen along the abdominal aorta and its branches. The extent of calcification at the proximal superior mesenteric artery raises concern for moderate to severe stenosis.  There is minimal haziness within the mesentery, without evidence of significant edema to suggest mesenteric ischemia.  The appendix is normal in caliber, without evidence for appendicitis. The colon is partially filled with stool and is unremarkable in appearance.  The bladder is moderately distended  and grossly unremarkable. The uterus is within normal limits. The ovaries are grossly symmetric. No suspicious adnexal masses are seen. No inguinal lymphadenopathy is seen.  No acute osseous abnormalities are identified. Vacuum phenomenon is noted along the lower thoracic and lumbar spine, with associated calcified disc protrusions along the lower lumbar spine. There is chronic loss of height at vertebral bodies T12 and L1.  IMPRESSION: 1. No acute abnormality seen to explain the patient's symptoms. 2. Calcification along the proximal superior mesenteric artery raises concern for moderate to severe stenosis. However, there is no evidence of significant mesenteric edema at this time to suggest mesenteric ischemia. The visualized bowel is grossly unremarkable, with minimal nonspecific haziness in the mesentery. 3. Scattered calcification along the abdominal aorta and its branches. 4. Increased interstitial markings at the lung bases may be chronic in nature, with  mild superimposed atelectasis. 5. Scattered coronary artery calcifications seen. 6. Mild cardiomegaly. 7. Apparent 6 mm retained stone incidentally noted at the gallbladder fossa.   Electronically Signed   By: Garald Balding M.D.   On: 05/01/2014 01:42   Ct Head Wo Contrast  04/30/2014   CLINICAL DATA:  Acute onset of altered mental status. Worsening confusion. Hypotension. Initial encounter.  EXAM: CT HEAD WITHOUT CONTRAST  TECHNIQUE: Contiguous axial images were obtained from the base of the skull through the vertex without intravenous contrast.  COMPARISON:  CT of the head performed 09/06/2013  FINDINGS: There is no evidence of acute infarction, mass lesion, or intra- or extra-axial hemorrhage on CT.  Prominence of the ventricles and sulci reflects moderate cortical volume loss. Scattered periventricular and subcortical white matter change likely reflects small vessel ischemic microangiopathy. Cerebellar atrophy is noted. A likely chronic lacunar infarct  is noted at the brainstem.  The fourth ventricle is within normal limits. The basal ganglia are unremarkable in appearance. The cerebral hemispheres demonstrate grossly normal gray-white differentiation. No mass effect or midline shift is seen.  There is no evidence of fracture; visualized osseous structures are unremarkable in appearance. The orbits are within normal limits. The paranasal sinuses and mastoid air cells are well-aerated. No significant soft tissue abnormalities are seen.  IMPRESSION: 1. No acute intracranial pathology seen on CT. 2. Moderate cortical volume loss and scattered small vessel ischemic microangiopathy. Chronic lacunar infarct suggested at the brainstem.   Electronically Signed   By: Garald Balding M.D.   On: 04/30/2014 22:52   Dg Chest Portable 1 View  04/30/2014   CLINICAL DATA:  Fatigue, fever, altered mental status. Symptoms for 1-3 days.  EXAM: PORTABLE CHEST - 1 VIEW  COMPARISON:  Chest CT 07/23/2012  FINDINGS: The heart is at the upper limits of normal in size. There is atherosclerosis of the thoracic aorta. Lung volumes are low, no airspace consolidation to suggest pneumonia. There is no large pleural effusion or pneumothorax. No pulmonary edema. No acute osseous abnormalities are seen, there is degenerative change in both shoulders.  IMPRESSION: No acute pulmonary process.   Electronically Signed   By: Jeb Levering M.D.   On: 04/30/2014 21:41    Scheduled Meds: . clopidogrel  75 mg Oral Daily  . heparin  5,000 Units Subcutaneous 3 times per day  . pantoprazole  40 mg Oral Daily  . piperacillin-tazobactam (ZOSYN)  IV  3.375 g Intravenous Q8H  . risperiDONE  0.25 mg Oral Daily  . sodium chloride  3 mL Intravenous Q12H  . vancomycin  750 mg Intravenous Q24H   Continuous Infusions: . sodium chloride 125 mL/hr at 05/02/14 [place], Lorriann Hansmann  [facility] Pager [phone]. If 8PM-8AM, please contact night-coverage at [email or web address], password  [facility] 05/02/2014, 9:01 AM  LOS: 2 days

## 2014-05-03 MED ORDER — LEVOFLOXACIN 750 MG PO TABS
750.0000 mg | ORAL_TABLET | Freq: Every day | ORAL | Status: DC
  Administered 2014-05-03 – 2014-05-04 (×2): 750 mg via ORAL
  Filled 2014-05-03 (×3): qty 1

## 2014-05-03 MED ORDER — FUROSEMIDE 20 MG PO TABS
10.0000 mg | ORAL_TABLET | Freq: Every day | ORAL | Status: DC
  Administered 2014-05-03 – 2014-05-04 (×2): 10 mg via ORAL
  Filled 2014-05-03 (×3): qty 0.5

## 2014-05-03 NOTE — Progress Notes (Signed)
CSW continuing to follow.  Pt admitted from [facility].   PT evaluation completed today and recommendation for Home health PT;SNF;Supervision/Assistance - 24 hour ALF will need to Determine level of care, pt currently needs assistance with all mobility.  CSW contacted [facility] [place] and spoke with RN, Sonia Baller. CSW discussed with ALF RN that PT evaluated and recommend that pt needs assistance/supervision with ambulations/transfers given that pt was ambulating with a walker without assistance prior to admission. Per ALF RN, Sonia Baller facility can provided pt with assistance upon return to ALF. Per ALF RN, [facility] has in-house therapy and will just need orders.  CSW visited pt room. Pt sleeping soundly at this time.   CSW contacted pt daughter, [name] via telephone, but unable to reach at this time. Message left.  Per MD, anticipate discharge tomorrow.  CSW to continue to follow to provide support and assist with pt disposition needs.   Alison Murray, MSW, [place] Work [phone]

## 2014-05-03 NOTE — Progress Notes (Signed)
[facility] PROGRESS NOTE    Assessment/Plan: Sepsis associated hypotension: - Unclear source, question aspiration pneumonia, transition antibiotics to oral Levaquin - She has remained afebrile leukocytosis improved. - Blood cultures are negative till date.swallowing evaluation show my risk for aspiration diet change to dysphagia 3 - KVO IV fluids.  Protein-calorie malnutrition, severe - Ensure 3 times a day.   Nausea vomiting and diarrhea: - Likely due to infectious process. - Resolved, CT scan of the abdomen and pelvis done on admission shows no acute pathology.  - LFTs within normal.   Hypokalemia: - Replace  Essential HTN (hypertension) - Cont home medications. Resume Lasix.  Code Status: Full Family Communication: none  Disposition Plan: inpatient   Consultants: None Procedures:  CT abdomen and pelvis 2.1.2016  Antibiotics: Vancomycin and Zosyn  HPI/Subjective: No complains  Objective: Filed Vitals:   05/02/14 0535 05/02/14 1344 05/02/14 2027 05/03/14 0617  BP: 130/56 131/54 139/54 139/60  Pulse: 67 61 66 64  Temp: 97.3 F (36.3 C) 97.4 F (36.3 C) 97.3 F (36.3 C) 98 F (36.7 C)  TempSrc: Oral Oral Oral Oral  Resp: 18 16 16 16   Height:      Weight:      SpO2: 100% 100% 99% 98%    Intake/Output Summary (Last 24 hours) at 05/03/14 0821 Last data filed at 05/03/14 0600  Gross per 24 hour  Intake 869.33 ml  Output   1550 ml  Net -680.67 ml   Filed Weights   04/30/14 2203 05/01/14 0152  Weight: 52.164 kg (115 lb) 54.477 kg (120 lb 1.6 oz)    Exam:  General: Alert, awake, oriented x3, in no acute distress.  HEENT: No bruits, no goiter.  Heart: Regular rate and rhythm. Lungs: Good air movement, clear Abdomen: Soft, nontender, nondistended, positive bowel sounds.  Neuro: Grossly intact, nonfocal.   Data Reviewed: Basic Metabolic Panel:  Recent Labs Lab 04/30/14 2153 05/01/14 0424 05/02/14 0430  NA 140 141 143    K 3.4* 3.3* 3.6  CL 102 107 112  CO2  --  26 23  GLUCOSE 127* 97 94  BUN 15 16 14   CREATININE 0.70 0.70 0.74  CALCIUM  --  8.1* 8.2*   Liver Function Tests:  Recent Labs Lab 04/30/14 2132  AST 26  ALT 22  ALKPHOS 92  BILITOT 0.7  PROT 6.6  ALBUMIN 3.2*    Recent Labs Lab 05/01/14 0424  LIPASE 20   No results for input(s): AMMONIA in the last 168 hours. CBC:  Recent Labs Lab 04/30/14 2132 04/30/14 2153 05/01/14 0424 05/02/14 0430  WBC 17.2*  --  15.8* 8.8  NEUTROABS 13.8*  --   --   --   HGB 11.0* 12.2 10.1* 10.0*  HCT 33.1* 36.0 30.9* 30.4*  MCV 90.7  --  91.4 92.1  PLT 249  --  237 245   Cardiac Enzymes: No results for input(s): CKTOTAL, CKMB, CKMBINDEX, TROPONINI in the last 168 hours. BNP (last 3 results) No results for input(s): BNP in the last 8760 hours.  ProBNP (last 3 results) No results for input(s): PROBNP in the last 8760 hours.  CBG:  Recent Labs Lab 04/30/14 2133  GLUCAP 120*    Recent Results (from the past 240 hour(s))  Culture, blood (routine x 2)     Status: None (Preliminary result)   Collection Time: 04/30/14  9:21 PM  Result Value Ref Range Status   Specimen Description BLOOD RIGHT FOREARM  Final   Special Requests  BOTTLES DRAWN AEROBIC AND ANAEROBIC 5CC  Final   Culture   Final           BLOOD CULTURE RECEIVED NO GROWTH TO DATE CULTURE WILL BE HELD FOR 5 DAYS BEFORE ISSUING A FINAL NEGATIVE REPORT Performed at [facility]    Report Status PENDING  Incomplete  Urine culture     Status: None   Collection Time: 04/30/14  9:29 PM  Result Value Ref Range Status   Specimen Description URINE, CATHETERIZED  Final   Special Requests NONE  Final   Colony Count NO GROWTH Performed at [facility]   Final   Culture NO GROWTH Performed at [facility]   Final   Report Status 05/01/2014 FINAL  Final  Culture, blood (routine x 2)     Status: None (Preliminary result)   Collection Time: 04/30/14  9:54 PM   Result Value Ref Range Status   Specimen Description BLOOD L FOREARM  Final   Special Requests BOTTLES DRAWN AEROBIC AND ANAEROBIC 5ML  Final   Culture   Final           BLOOD CULTURE RECEIVED NO GROWTH TO DATE CULTURE WILL BE HELD FOR 5 DAYS BEFORE ISSUING A FINAL NEGATIVE REPORT Performed at [facility]    Report Status PENDING  Incomplete  MRSA PCR Screening     Status: None   Collection Time: 05/01/14  2:30 AM  Result Value Ref Range Status   MRSA by PCR NEGATIVE NEGATIVE Final    Comment:        The GeneXpert MRSA Assay (FDA approved for NASAL specimens only), is one component of a comprehensive MRSA colonization surveillance program. It is not intended to diagnose MRSA infection nor to guide or monitor treatment for MRSA infections.   Clostridium Difficile by PCR     Status: None   Collection Time: 05/01/14  3:12 PM  Result Value Ref Range Status   C difficile by pcr NEGATIVE NEGATIVE Final    Comment: Performed at [facility]     Studies: No results found.  Scheduled Meds: . atenolol  50 mg Oral Daily  . clopidogrel  75 mg Oral Daily  . heparin  5,000 Units Subcutaneous 3 times per day  . lisinopril  10 mg Oral Daily  . pantoprazole  40 mg Oral Daily  . piperacillin-tazobactam (ZOSYN)  IV  3.375 g Intravenous Q8H  . risperiDONE  0.25 mg Oral Daily  . sodium chloride  3 mL Intravenous Q12H  . vancomycin  750 mg Intravenous Q24H   Continuous Infusions: . sodium chloride 10 mL/hr at 05/02/14 [place], Sheryl Shaw  [facility] Pager [phone]. If 8PM-8AM, please contact night-coverage at [email or web address], password [facility] 05/03/2014, 8:21 AM  LOS: 3 days

## 2014-05-03 NOTE — Progress Notes (Signed)
Speech Language Pathology Treatment: Dysphagia  Patient Details Name: Inette Doubrava MRN: [id number] DOB: 1924/12/08 Today's Date: 05/03/2014 Time: 2979-8921 SLP Time Calculation (min) (ACUTE ONLY): 10 min  Assessment / Plan / Recommendation Clinical Impression  Pt pleasantly confused, participatory.  Consumed dysphagia 3 textures with thin liquids with occasional throat-clearing as described in yesterday's evaluation, but generally with functional swallow with no significant concerns for aspiration.  Recommend continuing current diet - no further SLP needs identified.  Will sign off.    HPI HPI: Kerline Trahan is a 81 y.o. female brought here by EMS from SNF where she resides for her chronic dementia. Pt with AMS and unclear source of infection - CXR without acute infection.   Pertinent Vitals Pain Assessment: Faces Faces Pain Scale: Hurts even more Pain Location: when lower legs touched  SLP Plan  Continue with current plan of care    Recommendations Diet recommendations: Dysphagia 3 (mechanical soft);Thin liquid Liquids provided via: Cup Medication Administration: Whole meds with puree Supervision: Patient able to self feed;Intermittent supervision to cue for compensatory strategies Compensations: Slow rate;Small sips/bites Postural Changes and/or Swallow Maneuvers: Seated upright 90 degrees;Upright 30-60 min after meal              Oral Care Recommendations: Oral care BID Follow up Recommendations: None Plan: Continue with current plan of care   Mayia Megill L. Tivis Ringer, [place] CCC/SLP Pager [phone]      Juan Quam Laurice 05/03/2014, 1:13 PM

## 2014-05-03 NOTE — Evaluation (Signed)
Physical Therapy Evaluation Patient Details Name: Sheryl Shaw MRN: [id number] DOB: [birth year] Today's Date: 05/03/2014   History of Present Illness  81 yo female with history of dementia, comes from ALF where she is ambulatory, admitted with N/V/D, sepsis with associated  hypotension.  Clinical Impression  Patient pleasantly confused and able to participate in therapy today. Gait is festinating, most like more so than PTA as reportedly was ambulating w/ RW without assist. Patient will benefit from PT to address problems listed in note below.    Follow Up Recommendations Home health PT;SNF;Supervision/Assistance - 24 hour ALF will need to  Determine level of care, pt currently needs assistance with all mobility.   Equipment Recommendations  None recommended by PT    Recommendations for Other Services       Precautions / Restrictions Precautions Precautions: Fall      Mobility  Bed Mobility Overal bed mobility: Needs Assistance Bed Mobility: Supine to Sit     Supine to sit: Min assist     General bed mobility comments: extra support for  getting to sit upright.  Transfers Overall transfer level: Needs assistance Equipment used: Rolling walker (2 wheeled) Transfers: Sit to/from [facility] Sit to Stand: Mod assist, lifting assist from bed and BSC Stand pivot transfers: Mod assist from recliner to [facility] and back.       General transfer comment: cues for hand placement, posterior lean, retropulsive  Ambulation/Gait Ambulation/Gait assistance: Mod assist Ambulation Distance (Feet): 250 Feet Assistive device: Rolling walker (2 wheeled) Gait Pattern/deviations: Step-to pattern;Festinating;Decreased step length - left;Decreased stance time - right;Leaning posteriorly Gait velocity: slow  Gait velocity interpretation: <1.8 ft/sec, indicative of risk for recurrent falls General Gait Details: pt  with increased festinating gait  as she fatigued but did   ambulate , Assist to maintain forward progression during gait.  Stairs            Wheelchair Mobility    Modified Rankin (Stroke Patients Only)       Balance Overall balance assessment: Needs assistance Sitting-balance support: Feet supported;No upper extremity supported Sitting balance-Leahy Scale: Fair   Postural control: Posterior lean Standing balance support: During functional activity;Bilateral upper extremity supported Standing balance-Leahy Scale: Poor                               Pertinent Vitals/Pain Pain Assessment: Faces Faces Pain Scale: Hurts even more Pain Location: when lower legs touched    Home Living Family/patient expects to be discharged to:: Assisted living               Home Equipment: Walker - 2 wheels Additional Comments: per RN per daughter, pt ambulated with RW on unit Mod. I.    Prior Function           Comments: uncertain level of independence, areas needing  assistance.     Hand Dominance        Extremity/Trunk Assessment   Upper Extremity Assessment: Generalized weakness           Lower Extremity Assessment: RLE deficits/detail;LLE deficits/detail RLE Deficits / Details: decreased full knee extension LLE Deficits / Details: same as R  Cervical / Trunk Assessment: Kyphotic  Communication   Communication: No difficulties  Cognition Arousal/Alertness: Awake/alert Behavior During Therapy: WFL for tasks assessed/performed Overall Cognitive Status: History of cognitive impairments - at baseline       Memory: Decreased short-term memory  General Comments      Exercises        Assessment/Plan    PT Assessment Patient needs continued PT services  PT Diagnosis Abnormality of gait;Altered mental status   PT Problem List Decreased strength;Decreased activity tolerance;Decreased balance;Decreased mobility;Decreased knowledge of precautions;Decreased safety awareness;Decreased  knowledge of use of DME;Pain  PT Treatment Interventions DME instruction;Gait training;Functional mobility training;Therapeutic activities;Therapeutic exercise;Patient/family education;Balance training   PT Goals (Current goals can be found in the Care Plan section) Acute Rehab PT Goals Patient Stated Goal: pt unable PT Goal Formulation: Patient unable to participate in goal setting Time For Goal Achievement: 05/17/14 Potential to Achieve Goals: Good    Frequency Min 3X/week   Barriers to discharge Decreased caregiver support ALF will need to determine if 24/7 care is provided. ptient require 1 person to ambulate at all times.    Co-evaluation               End of Session Equipment Utilized During Treatment: Gait belt Activity Tolerance: Patient tolerated treatment well Patient left: in chair;with call bell/phone within reach;with chair alarm set Nurse Communication: Mobility status         Time: 0300-9233 PT Time Calculation (min) (ACUTE ONLY): 21 min   Charges:   PT Evaluation $Initial PT Evaluation Tier I: 1 Procedure     PT G CodesClaretha Cooper 05/03/2014, 1:00 PM

## 2014-05-04 MED ORDER — LEVOFLOXACIN 750 MG PO TABS: 750.0000 mg | ORAL_TABLET | Freq: Every day | ORAL | Status: DC

## 2014-05-04 NOTE — Progress Notes (Signed)
Pt for discharge to [facility].   CSW facilitated pt discharge needs including contacting facility, faxing pt discharge information, discussing with pt at bedside and pt daughter, Webb Silversmith via telephone. CSW confirmed with [facility] that discharge information received and reviewed and pt can return. Pt daughter, Webb Silversmith plans to transport via private vehicle. Discharge packet provided at bedside.   Pt daughter pleased that pt can return to her ALF and get stronger through home health PT.   No further social work needs identified at this time.  CSW signing off.   Alison Murray, MSW, [place] Work [phone]

## 2014-05-04 NOTE — Progress Notes (Signed)
Patine d/c home. Stable.Sandie Ano RN

## 2014-05-04 NOTE — Progress Notes (Signed)
Report given to [facility] clinical support at [facility].Sandie Ano RN

## 2014-05-04 NOTE — Discharge Summary (Signed)
Physician Discharge Summary  Sheryl Shaw GTX:[id number] DOB: 12/11/1924 DOA: 04/30/2014  PCP: Lujean Amel, MD  Admit date: 04/30/2014 Discharge date: 05/04/2014  Time spent: 30 minutes  Recommendations for Outpatient Follow-up:  1. Follow up with PCP  Discharge Diagnoses:  Principal Problem:   Sepsis associated hypotension Active Problems:   HTN (hypertension)   SIRS (systemic inflammatory response syndrome)   Nausea vomiting and diarrhea   Protein-calorie malnutrition, severe   Discharge Condition: stable  Diet recommendation:  Dys 3  Filed Weights   04/30/14 2203 05/01/14 0152  Weight: 52.164 kg (115 lb) 54.477 kg (120 lb 1.6 oz)    History of present illness:  81 y.o. female brought here by EMS from SNF where she resides for her chronic dementia. According to family members patient has had increased confusion, warm to touch, N/V/D, one bout of NBNB emesis today at lunch. She has low back pain which family thinks is pretty chronic. There was some mention of foul smelling urine at the NH as well. Remainder of ROS negative.  Hospital Course:  Sepsis of unclear source with hypotension: - Unclear source, questionable aspiration, she was started on Rocephin and azithromycin dimension. - A swallowing evaluation was done that did not show she is at risk of aspiration. - She defervesced, her leukocytosis resolved, she was transitioned to oral Levaquin which she will continue for a total of 7 days.  Protein caloric malnutrition: Ensure 3 times a day.  Nausea vomiting and diarrhea: - Of unclear etiology. CT scan of the abdomen and pelvis was done that showed no acute pathology. LFTs within normal limits.  Hypokalemia: Probably due to decreased intravascular volume this was repleted and resolved.  Essential hypertension: - No changes were made continue home regimen.  Procedures:  CT scan of the abdomen and pelvis on 04/30/2004  Consultations:  none  Discharge  Exam: Filed Vitals:   05/04/14 0538  BP: 160/73  Pulse: 63  Temp: 97.8 F (36.6 C)  Resp: 18    General: A&O x3 Cardiovascular: RRR Respiratory: good air movement CTA B/L  Discharge Instructions    Current Discharge Medication List    START taking these medications   Details  levofloxacin (LEVAQUIN) 750 MG tablet Take 1 tablet (750 mg total) by mouth daily. Qty: 3 tablet, Refills: 0      CONTINUE these medications which have NOT CHANGED   Details  acetaminophen (TYLENOL) 325 MG tablet Take 325 mg by mouth every 6 (six) hours as needed (pain).    atenolol (TENORMIN) 50 MG tablet Take 50 mg by mouth daily.    calcium carbonate (TUMS - DOSED IN MG ELEMENTAL CALCIUM) 500 MG chewable tablet Chew 2 tablets by mouth every 8 (eight) hours as needed for indigestion or heartburn (reflux).    clopidogrel (PLAVIX) 75 MG tablet Take 1 tablet (75 mg total) by mouth daily. Qty: 30 tablet, Refills: 6    furosemide (LASIX) 20 MG tablet Take 10 mg by mouth daily.    lisinopril (PRINIVIL,ZESTRIL) 10 MG tablet Take 10 mg by mouth daily.     loperamide (IMODIUM A-D) 2 MG tablet Take 2 mg by mouth as needed for diarrhea or loose stools.    NON FORMULARY Take 1 each by mouth 2 (two) times daily with breakfast and lunch. "house shake"    potassium chloride (K-DUR) 10 MEQ tablet Take 10 mEq by mouth daily.    risperiDONE (RISPERDAL) 0.25 MG tablet Take 0.25 mg by mouth daily.      STOP  taking these medications     omeprazole (PRILOSEC) 20 MG capsule        Allergies  Allergen Reactions  . Codeine Other (See Comments)    Unknown (on MAR)      The results of significant diagnostics from this hospitalization (including imaging, microbiology, ancillary and laboratory) are listed below for reference.    Significant Diagnostic Studies: Ct Abdomen Pelvis Wo Contrast  05/01/2014   CLINICAL DATA:  Acute onset of fatigue and low grade fever. Elevated lactic acid and leukocytosis.  Initial encounter.  EXAM: CT ABDOMEN AND PELVIS WITHOUT CONTRAST  TECHNIQUE: Multidetector CT imaging of the abdomen and pelvis was performed following the standard protocol without IV contrast.  COMPARISON:  MRI of the pelvis performed 08/22/2004, and abdominal ultrasound performed 11/11/2011  FINDINGS: Increased interstitial markings at the lung bases may be chronic in nature, with mild superimposed atelectasis. Scattered coronary artery calcifications are seen. The heart is mildly enlarged.  The liver and spleen are unremarkable in appearance. The patient is status post cholecystectomy. An apparent 6 mm retained stone is incidentally seen. Prominence of the common hepatic duct to 1.4 cm may simply reflect prior cholecystectomy, as there is no definite evidence of distal obstruction. The pancreas and adrenal glands are unremarkable.  The kidneys are unremarkable in appearance. There is no evidence of hydronephrosis. No renal or ureteral stones are seen. No perinephric stranding is appreciated.  No free fluid is identified. The small bowel is unremarkable in appearance. The stomach is within normal limits. No acute vascular abnormalities are seen. Scattered calcification is seen along the abdominal aorta and its branches. The extent of calcification at the proximal superior mesenteric artery raises concern for moderate to severe stenosis.  There is minimal haziness within the mesentery, without evidence of significant edema to suggest mesenteric ischemia.  The appendix is normal in caliber, without evidence for appendicitis. The colon is partially filled with stool and is unremarkable in appearance.  The bladder is moderately distended and grossly unremarkable. The uterus is within normal limits. The ovaries are grossly symmetric. No suspicious adnexal masses are seen. No inguinal lymphadenopathy is seen.  No acute osseous abnormalities are identified. Vacuum phenomenon is noted along the lower thoracic and lumbar  spine, with associated calcified disc protrusions along the lower lumbar spine. There is chronic loss of height at vertebral bodies T12 and L1.  IMPRESSION: 1. No acute abnormality seen to explain the patient's symptoms. 2. Calcification along the proximal superior mesenteric artery raises concern for moderate to severe stenosis. However, there is no evidence of significant mesenteric edema at this time to suggest mesenteric ischemia. The visualized bowel is grossly unremarkable, with minimal nonspecific haziness in the mesentery. 3. Scattered calcification along the abdominal aorta and its branches. 4. Increased interstitial markings at the lung bases may be chronic in nature, with mild superimposed atelectasis. 5. Scattered coronary artery calcifications seen. 6. Mild cardiomegaly. 7. Apparent 6 mm retained stone incidentally noted at the gallbladder fossa.   Electronically Signed   By: Garald Balding M.D.   On: 05/01/2014 01:42   Ct Head Wo Contrast  04/30/2014   CLINICAL DATA:  Acute onset of altered mental status. Worsening confusion. Hypotension. Initial encounter.  EXAM: CT HEAD WITHOUT CONTRAST  TECHNIQUE: Contiguous axial images were obtained from the base of the skull through the vertex without intravenous contrast.  COMPARISON:  CT of the head performed 09/06/2013  FINDINGS: There is no evidence of acute infarction, mass lesion, or intra- or extra-axial  hemorrhage on CT.  Prominence of the ventricles and sulci reflects moderate cortical volume loss. Scattered periventricular and subcortical white matter change likely reflects small vessel ischemic microangiopathy. Cerebellar atrophy is noted. A likely chronic lacunar infarct is noted at the brainstem.  The fourth ventricle is within normal limits. The basal ganglia are unremarkable in appearance. The cerebral hemispheres demonstrate grossly normal gray-white differentiation. No mass effect or midline shift is seen.  There is no evidence of fracture;  visualized osseous structures are unremarkable in appearance. The orbits are within normal limits. The paranasal sinuses and mastoid air cells are well-aerated. No significant soft tissue abnormalities are seen.  IMPRESSION: 1. No acute intracranial pathology seen on CT. 2. Moderate cortical volume loss and scattered small vessel ischemic microangiopathy. Chronic lacunar infarct suggested at the brainstem.   Electronically Signed   By: Garald Balding M.D.   On: 04/30/2014 22:52   Dg Chest Portable 1 View  04/30/2014   CLINICAL DATA:  Fatigue, fever, altered mental status. Symptoms for 1-3 days.  EXAM: PORTABLE CHEST - 1 VIEW  COMPARISON:  Chest CT 07/23/2012  FINDINGS: The heart is at the upper limits of normal in size. There is atherosclerosis of the thoracic aorta. Lung volumes are low, no airspace consolidation to suggest pneumonia. There is no large pleural effusion or pneumothorax. No pulmonary edema. No acute osseous abnormalities are seen, there is degenerative change in both shoulders.  IMPRESSION: No acute pulmonary process.   Electronically Signed   By: Jeb Levering M.D.   On: 04/30/2014 21:41    Microbiology: Recent Results (from the past 240 hour(s))  Culture, blood (routine x 2)     Status: None (Preliminary result)   Collection Time: 04/30/14  9:21 PM  Result Value Ref Range Status   Specimen Description BLOOD RIGHT FOREARM  Final   Special Requests BOTTLES DRAWN AEROBIC AND ANAEROBIC 5CC  Final   Culture   Final           BLOOD CULTURE RECEIVED NO GROWTH TO DATE CULTURE WILL BE HELD FOR 5 DAYS BEFORE ISSUING A FINAL NEGATIVE REPORT Performed at [facility]    Report Status PENDING  Incomplete  Urine culture     Status: None   Collection Time: 04/30/14  9:29 PM  Result Value Ref Range Status   Specimen Description URINE, CATHETERIZED  Final   Special Requests NONE  Final   Colony Count NO GROWTH Performed at [facility]   Final   Culture NO  GROWTH Performed at [facility]   Final   Report Status 05/01/2014 FINAL  Final  Culture, blood (routine x 2)     Status: None (Preliminary result)   Collection Time: 04/30/14  9:54 PM  Result Value Ref Range Status   Specimen Description BLOOD L FOREARM  Final   Special Requests BOTTLES DRAWN AEROBIC AND ANAEROBIC 5ML  Final   Culture   Final           BLOOD CULTURE RECEIVED NO GROWTH TO DATE CULTURE WILL BE HELD FOR 5 DAYS BEFORE ISSUING A FINAL NEGATIVE REPORT Performed at [facility]    Report Status PENDING  Incomplete  MRSA PCR Screening     Status: None   Collection Time: 05/01/14  2:30 AM  Result Value Ref Range Status   MRSA by PCR NEGATIVE NEGATIVE Final    Comment:        The GeneXpert MRSA Assay (FDA approved for NASAL specimens only),  is one component of a comprehensive MRSA colonization surveillance program. It is not intended to diagnose MRSA infection nor to guide or monitor treatment for MRSA infections.   Clostridium Difficile by PCR     Status: None   Collection Time: 05/01/14  3:12 PM  Result Value Ref Range Status   C difficile by pcr NEGATIVE NEGATIVE Final    Comment: Performed at [facility]: Basic Metabolic Panel:  Recent Labs Lab 04/30/14 2153 05/01/14 0424 05/02/14 0430  NA 140 141 143  K 3.4* 3.3* 3.6  CL 102 107 112  CO2  --  26 23  GLUCOSE 127* 97 94  BUN 15 16 14   CREATININE 0.70 0.70 0.74  CALCIUM  --  8.1* 8.2*   Liver Function Tests:  Recent Labs Lab 04/30/14 2132  AST 26  ALT 22  ALKPHOS 92  BILITOT 0.7  PROT 6.6  ALBUMIN 3.2*    Recent Labs Lab 05/01/14 0424  LIPASE 20   No results for input(s): AMMONIA in the last 168 hours. CBC:  Recent Labs Lab 04/30/14 2132 04/30/14 2153 05/01/14 0424 05/02/14 0430  WBC 17.2*  --  15.8* 8.8  NEUTROABS 13.8*  --   --   --   HGB 11.0* 12.2 10.1* 10.0*  HCT 33.1* 36.0 30.9* 30.4*  MCV 90.7  --  91.4 92.1  PLT 249  --  237 245    Cardiac Enzymes: No results for input(s): CKTOTAL, CKMB, CKMBINDEX, TROPONINI in the last 168 hours. BNP: BNP (last 3 results) No results for input(s): BNP in the last 8760 hours.  ProBNP (last 3 results) No results for input(s): PROBNP in the last 8760 hours.  CBG:  Recent Labs Lab 04/30/14 2133  GLUCAP 120*       Signed:  Charlynne Cousins  [facility] 05/04/2014, 8:27 AM

## 2014-05-04 NOTE — Progress Notes (Signed)
Pt for discharge to [facility] today.  CSW spoke with pt daughter, [name] via telephone and discussed that [place] spoke with [facility] yesterday who confirmed that they could provide supervision/assistance with pt ambulations/transfers when pt returns. Pt daughter agreeable to pt returning to [facility] today.   CSW contacted facility and faxed pt discharge information to facility. CSW awaiting facility to review information in order for CSW to arrange with pt other daughter, [name] transportation back to ALF.   CSW to continue to follow and facilitate pt discharge needs today.   Alison Murray, MSW, [place] [employer] [phone]

## 2014-05-04 NOTE — Progress Notes (Signed)
Patient's d/c instructions given to daughter,verbalized understanding. Patient is stable to d/c to [facility]. No c/o pain.Sandie Ano RN

## 2014-05-04 NOTE — Plan of Care (Signed)
Problem: Phase I Progression Outcomes Goal: OOB as tolerated unless otherwise ordered Outcome: Progressing Patient requires two assists with transfers to [facility].

## 2014-05-04 NOTE — Care Management Note (Signed)
CARE MANAGEMENT NOTE 05/04/2014  Patient:  [facility]   Account Number:  [id number]  Date Initiated:  05/01/2014  Documentation initiated by:  Marney Doctor  Subjective/Objective Assessment:   81 yo admitted with Sepsis     Action/Plan:   Pt from [facility]   Anticipated DC Date:  05/04/2014   Anticipated DC Plan:  ASSISTED LIVING / [place]  In-house referral  Clinical Social Worker      DC Planning Services  CM consult      Choice offered to / List presented to:             Status of service:  In process, will continue to follow Medicare Important Message given?  YES (If response is "NO", the following Medicare IM given date fields will be blank) Date Medicare IM given:  05/04/2014 Medicare IM given by:  Marney Doctor Date Additional Medicare IM given:   Additional Medicare IM given by:    Discharge Disposition:    Per UR Regulation:  Reviewed for med. necessity/level of care/duration of stay  If discussed at [facility] of Stay Meetings, dates discussed:    Comments:  05/04/14 Marney Doctor RN,BSN,NCM Pt to DC back to [facility] today.  Per CSW the facility has PT/OT services there and will just need orders sent with DC paperwork.  PT/OT orders written by MD. No CM needs noted.  05/01/14 Marney Doctor RN,BSN,NCM PT to evaluate pt.  Potentially could need HHPT at ALF. Will await recommendations.

## 2014-05-07 LAB — CULTURE, BLOOD (ROUTINE X 2)
Culture: NO GROWTH
Culture: NO GROWTH

## 2014-06-08 ENCOUNTER — Encounter: Payer: Self-pay | Admitting: Internal Medicine

## 2014-06-08 ENCOUNTER — Ambulatory Visit (INDEPENDENT_AMBULATORY_CARE_PROVIDER_SITE_OTHER): Payer: Medicare Other | Admitting: Internal Medicine

## 2014-06-08 VITALS — BP 104/60 | HR 64 | Ht 67.0 in | Wt 113.0 lb

## 2014-06-08 DIAGNOSIS — R1310 Dysphagia, unspecified: Secondary | ICD-10-CM | POA: Diagnosis not present

## 2014-06-08 DIAGNOSIS — Z8719 Personal history of other diseases of the digestive system: Secondary | ICD-10-CM

## 2014-06-08 NOTE — Patient Instructions (Signed)
You have been scheduled for a Barium Esophogram at [facility] (1st floor of the hospital) on 06/14/2014 at 2:00pm. Please arrive 15 minutes prior to your appointment for registration. Make certain not to have anything to eat or drink 3 hours prior to your test. If you need to reschedule for any reason, please contact radiology at [phone] to do so. __________________________________________________________________ A barium swallow is an examination that concentrates on views of the esophagus. This tends to be a double contrast exam (barium and two liquids which, when combined, create a gas to distend the wall of the oesophagus) or single contrast (non-ionic iodine based). The study is usually tailored to your symptoms so a good history is essential. Attention is paid during the study to the form, structure and configuration of the esophagus, looking for functional disorders (such as aspiration, dysphagia, achalasia, motility and reflux) EXAMINATION You may be asked to change into a gown, depending on the type of swallow being performed. A radiologist and radiographer will perform the procedure. The radiologist will advise you of the type of contrast selected for your procedure and direct you during the exam. You will be asked to stand, sit or lie in several different positions and to hold a small amount of fluid in your mouth before being asked to swallow while the imaging is performed .In some instances you may be asked to swallow barium coated marshmallows to assess the motility of a solid food bolus. The exam can be recorded as a digital or video fluoroscopy procedure. POST PROCEDURE It will take 1-2 days for the barium to pass through your system. To facilitate this, it is important, unless otherwise directed, to increase your fluids for the next 24-48hrs and to resume your normal diet.  This test typically takes about 30 minutes to  perform. __________________________________________________________________________________

## 2014-06-08 NOTE — Progress Notes (Signed)
Julian Askin 1924-06-30 [id number]  Note: This dictation was prepared with Dragon digital system. Any transcriptional errors that result from this procedure are unintentional.   History of Present Illness: This is a 81 year old white female with recent hospitalization for sepsis and hypotension ( 2/1/ to  05/04/2014 . She was having episodes of choking on food before her daughter found her delirious. She has a history of benign distal esophageal stricture which was dilated on multiple occasions by Dr Lyla Son up until 12 years ago. Her dysphagia has been described as for pills as well as for solids. While in the hospital CT scan of the abdomen showed postcholecystectomy state and a 6 mm retained stone in the gallbladder fossa.. Abdominal ultrasound in August 2013 showed 12 mm common bile duct and 3 mm main pancreatic duct. She has a history of anemia  . Last colonoscopy in May 2003- anal stenosis.. Since she has been on Prilosec 20 mg twice a day her dysphagia has  Improved. Speech Path evaluation in the hospital confirmed penetration but no aspiration.    Past Medical History  Diagnosis Date  . Hypertension   . GERD (gastroesophageal reflux disease)   . Palpitations   . Chronic fatigue   . Syncope 2007  . Vertebrobasilar TIAs   . Optic nerve disorder     right  . Nausea   . Arteriosclerotic heart disease     Past Surgical History  Procedure Laterality Date  . Rectal polypectomy    . Cataract extraction, bilateral    . Transthoracic echocardiogram  04/02/2005    EF 50-60%  . Esophageal dilitation      Allergies  Allergen Reactions  . Codeine Other (See Comments)    Unknown (on MAR)  . Mucinex [Guaifenesin Er] Other (See Comments)    Makes pt hyper and can't sleep    Family history and social history have been reviewed.  Review of Systems: Solid food dysphagia. Occasional cough. Weight loss.  The remainder of the 10 point ROS is negative except as outlined in the  H&P  Physical Exam: General Appearance thin frail appearing in no distress Eyes  Non icteric  HEENT  Non traumatic, normocephalic  Mouth No lesion, tongue papillated, no cheilosis Neck Supple without adenopathy, thyroid not enlarged, no carotid bruits, no JVD Lungs Clear to auscultation bilaterally COR Normal S1, normal S2, regular rhythm, no murmur, quiet precordium Abdomen soft nontender with normoactive bowel sounds. Rectal not done Extremities  No pedal edema Skin No lesions Neurological Alert and oriented x 3 Psychological Normal mood and affect  Assessment and Plan:   81 year old white female with history of benign distal esophageal stricture, now with dysphagia to pills and predominantly solids. She is very frail and may not tolerate upper endoscopy and sedation. We will therefore proceed with barium esophagram first to assess for esophageal stricture. I suspect she has a element of esophageal dysmotility. Continue Prilosec 20 mg daily and strict antireflux measures. She was evaluated by speech pathology in the hospital and was told that there is some penetration but no clear aspiration. She will follow strict antireflux measures.  Dr D. Nicki [place] 06/08/2014

## 2014-06-14 ENCOUNTER — Ambulatory Visit (HOSPITAL_COMMUNITY)
Admission: RE | Admit: 2014-06-14 | Discharge: 2014-06-14 | Disposition: A | Discharge status: Home / Self Care | Payer: Medicare Other | Source: Ambulatory Visit | Attending: Internal Medicine | Admitting: Internal Medicine

## 2014-06-14 DIAGNOSIS — R1310 Dysphagia, unspecified: Secondary | ICD-10-CM | POA: Diagnosis present

## 2014-06-15 ENCOUNTER — Other Ambulatory Visit: Payer: Self-pay | Admitting: *Deleted

## 2014-06-18 ENCOUNTER — Telehealth: Payer: Self-pay | Admitting: *Deleted

## 2014-06-18 ENCOUNTER — Encounter: Payer: Self-pay | Admitting: *Deleted

## 2014-06-18 NOTE — Telephone Encounter (Signed)
OK to hold Plavix for 5 days for procedure   Peter Martinique MD, [facility]       Previous Messages     ----- Message -----   From: Hulan Saas, RN   Sent: 06/18/2014 11:27 AM    To: Peter M Martinique, MD       RE:  Sheryl Shaw  DOB: Mar 24, 1925  MRN: [id number]   Dear Dr. Martinique,   We have scheduled the above patient for an endoscopy procedure. Our records show that she is on anticoagulation therapy(Plavix). Please, advise as to whether patient may hold Plavix five days prior to procedure on 06/25/14. Please, route your response to [name],   Sincerely,    [name], RN      Spoke with patient's daughter [name] and gave her instructions to hold Plavix starting on 06/20/14. Called [facility] at [phone] and faxed order to [phone] as per [place].

## 2014-06-21 ENCOUNTER — Ambulatory Visit (AMBULATORY_SURGERY_CENTER): Payer: Self-pay | Admitting: *Deleted

## 2014-06-21 VITALS — Ht 65.0 in | Wt 118.0 lb

## 2014-06-21 DIAGNOSIS — R1314 Dysphagia, pharyngoesophageal phase: Secondary | ICD-10-CM

## 2014-06-21 NOTE — Progress Notes (Signed)
Daughter ann Landry Mellow is POA and here for her mom's PV. I  have a copy of the POAto be scanned in the chart  No egg or soy allergy Post op N/V but no other issues  No issues with past intubation [name] states she is slumped over and her neck is very stiff , she holds it forward all the time but she thinks it will be ok for the position of the egd  OV 06-08-14 with Dr Olevia Perches.  Had a barium swallow that showed a tapered stricture at the distal thoracic esophagus . Mild sedation per dr Diona Browner note

## 2014-06-25 ENCOUNTER — Encounter: Payer: Self-pay | Admitting: Internal Medicine

## 2014-06-25 ENCOUNTER — Ambulatory Visit (AMBULATORY_SURGERY_CENTER): Payer: Medicare Other | Admitting: Internal Medicine

## 2014-06-25 VITALS — BP 138/56 | HR 56 | Temp 95.5°F | Resp 19 | Ht 67.0 in | Wt 113.0 lb

## 2014-06-25 DIAGNOSIS — K222 Esophageal obstruction: Secondary | ICD-10-CM

## 2014-06-25 DIAGNOSIS — R1310 Dysphagia, unspecified: Secondary | ICD-10-CM | POA: Diagnosis present

## 2014-06-25 MED ORDER — SODIUM CHLORIDE 0.9 % IV SOLN: 500.0000 mL | INTRAVENOUS | Status: DC

## 2014-06-25 NOTE — Op Note (Signed)
[place]  [address] [place], [id number]   ENDOSCOPY PROCEDURE REPORT  PATIENT: Sheryl, Shaw  MR#: [id number] BIRTHDATE: 1924-08-13 , 27  yrs. old GENDER: female ENDOSCOPIST: Lafayette Dragon, MD REFERRED BY:  Lujean Amel, M.D. PROCEDURE DATE:  06/25/2014 PROCEDURE:  EGD w/ wire guided (savary) dilation ASA CLASS:     Class III INDICATIONS:  dysphagia and History of esophageal stricture dilated to 12 years ago.  Recent hospitalization for sepsis and hypotension.  Possible aspiration.  Known history of esophageal dysmotility.  She speech pathology evaluation confirmed penetration but no aspiration.  Plavix on hold for 5 days. MEDICATIONS: Monitored anesthesia care and Propofol 80 mg IV TOPICAL ANESTHETIC: none  DESCRIPTION OF PROCEDURE: After the risks benefits and alternatives of the procedure were thoroughly explained, informed consent was obtained.  The [id number] endoscope was introduced through the mouth and advanced to the second portion of the duodenum , Without limitations.  The instrument was slowly withdrawn as the mucosa was fully examined.    Esophagus: esophagus was intubated without difficulty. Esophageal mucosa appeared normal in proximal, mid and distal esophagus. There was a benign appearing mild esophageal stricture at the GE junction which allowed the endoscope to traverse into the stomach with mild resistance. Diameter of the stricture was around 13 mm. There was small amount of bleeding from the stricture as the endoscope traversed into the stomach. There was no evidence of acute esophagitis  SOmach:   stomach was insufflated with air and showed multiple fundic gland polyps. There was nonspecific erythema in the gastric antrum. No erosions. Gastric outlet was normal. Retroflexion of endoscope revealed normal fundus and cardia  Duodenum: duodenal bulb and descending duodenum was normal Savary dilators then passed over  a guidewire starting with 12 mm, next 14 mm, 15 mm and 16 mm dilators which passed over the guidewire without difficulty. There was small amount of blood on the last 2 dilators[         The scope was then withdrawn from the patient and the procedure completed.  COMPLICATIONS: There were no immediate complications.  ENDOSCOPIC IMPRESSION:  1. mild distal esophageal stricture. Status post dilatation with Savary dilators from 12-16 mm 2.Small sliding hiatal hernia 3. Fundic gland polyps   RECOMMENDATIONS: 1.  Anti-reflux regimen to be follow 2.  Continue PPI 3. resume Plavix  patient with history of presbyesophagus demonstrated on remote upper GI series. I suspect that her dysphagia is a result of esophageal stricture as well as of esophageal dysmotility.  REPEAT EXAM: no  eSigned:  Lafayette Dragon, MD 06/25/2014 10:32 AM    CC:  PATIENT NAME:  Sheryl Shaw, Sheryl Shaw MR#: [id number]

## 2014-06-25 NOTE — Patient Instructions (Addendum)
YOU HAD AN ENDOSCOPIC PROCEDURE TODAY AT [facility]:   Refer to the procedure report that was given to you for any specific questions about what was found during the examination.  If the procedure report does not answer your questions, please call your gastroenterologist to clarify.  If you requested that your care partner not be given the details of your procedure findings, then the procedure report has been included in a sealed envelope for you to review at your convenience later.  YOU SHOULD EXPECT: Some feelings of bloating in the abdomen. Passage of more gas than usual.  Walking can help get rid of the air that was put into your GI tract during the procedure and reduce the bloating. If you had a lower endoscopy (such as a colonoscopy or flexible sigmoidoscopy) you may notice spotting of blood in your stool or on the toilet paper. If you underwent a bowel prep for your procedure, you may not have a normal bowel movement for a few days.  Please Note:  You might notice some irritation and congestion in your nose or some drainage.  This is from the oxygen used during your procedure.  There is no need for concern and it should clear up in a day or so.  SYMPTOMS TO REPORT IMMEDIATELY:   Following upper endoscopy (EGD)  Vomiting of blood or coffee ground material  New chest pain or pain under the shoulder blades  Painful or persistently difficult swallowing  New shortness of breath  Fever of 100F or higher  Black, tarry-looking stools   For urgent or emergent issues, a gastroenterologist can be reached at any hour by calling [phone].   DIET: Your first meal following the procedure should be a small meal and then it is ok to progress to your normal diet. Heavy or fried foods are harder to digest and may make you feel nauseous or bloated.  Likewise, meals heavy in dairy and vegetables can increase bloating.  Drink plenty of fluids but you should avoid alcoholic beverages  for 24 hours.  ACTIVITY:  You should plan to take it easy for the rest of today and you should NOT DRIVE or use heavy machinery until tomorrow (because of the sedation medicines used during the test).    FOLLOW UP: Our staff will call the number listed on your records the next business day following your procedure to check on you and address any questions or concerns that you may have regarding the information given to you following your procedure. If we do not reach you, we will leave a message.  However, if you are feeling well and you are not experiencing any problems, there is no need to return our call.  We will assume that you have returned to your regular daily activities without incident.  Recommendations Dilation diet instructions given and handout provided. Resume Plavix today. Discharge instructions given to patient and/or care partner.

## 2014-06-25 NOTE — Progress Notes (Signed)
A/ox3 pleased with MAC, report to Wendy RN 

## 2014-06-26 ENCOUNTER — Telehealth: Payer: Self-pay | Admitting: *Deleted

## 2014-06-26 NOTE — Telephone Encounter (Signed)
  Follow up Call-  Call back number 06/25/2014  Post procedure Call Back phone  # [phone]  Permission to leave phone message Yes     Patient questions:  Do you have a fever, pain , or abdominal swelling? No. Pain Score  0 *  Have you tolerated food without any problems? Yes.    Have you been able to return to your normal activities? Yes.    Do you have any questions about your discharge instructions: Diet   No. Medications  No. Follow up visit  No.  Do you have questions or concerns about your Care? No.  Actions: * If pain score is 4 or above: No action needed, pain <4.  Information provided via [facility] stated that she talked to her mother last night and she was doing fine.

## 2014-08-20 ENCOUNTER — Emergency Department (HOSPITAL_COMMUNITY): Payer: Medicare Other

## 2014-08-20 ENCOUNTER — Emergency Department (HOSPITAL_COMMUNITY)
Admission: EM | Admit: 2014-08-20 | Discharge: 2014-08-20 | Disposition: A | Discharge status: Home / Self Care | Payer: Medicare Other | Attending: Emergency Medicine | Admitting: Emergency Medicine

## 2014-08-20 ENCOUNTER — Encounter (HOSPITAL_COMMUNITY): Payer: Self-pay | Admitting: Emergency Medicine

## 2014-08-20 DIAGNOSIS — S3992XA Unspecified injury of lower back, initial encounter: Secondary | ICD-10-CM | POA: Diagnosis not present

## 2014-08-20 DIAGNOSIS — Y92128 Other place in nursing home as the place of occurrence of the external cause: Secondary | ICD-10-CM | POA: Diagnosis not present

## 2014-08-20 DIAGNOSIS — Z8669 Personal history of other diseases of the nervous system and sense organs: Secondary | ICD-10-CM | POA: Insufficient documentation

## 2014-08-20 DIAGNOSIS — K219 Gastro-esophageal reflux disease without esophagitis: Secondary | ICD-10-CM | POA: Diagnosis not present

## 2014-08-20 DIAGNOSIS — I10 Essential (primary) hypertension: Secondary | ICD-10-CM | POA: Diagnosis not present

## 2014-08-20 DIAGNOSIS — W19XXXA Unspecified fall, initial encounter: Secondary | ICD-10-CM

## 2014-08-20 DIAGNOSIS — S99911A Unspecified injury of right ankle, initial encounter: Secondary | ICD-10-CM | POA: Diagnosis not present

## 2014-08-20 DIAGNOSIS — Y9301 Activity, walking, marching and hiking: Secondary | ICD-10-CM | POA: Insufficient documentation

## 2014-08-20 DIAGNOSIS — W01198A Fall on same level from slipping, tripping and stumbling with subsequent striking against other object, initial encounter: Secondary | ICD-10-CM | POA: Diagnosis not present

## 2014-08-20 DIAGNOSIS — I2510 Atherosclerotic heart disease of native coronary artery without angina pectoris: Secondary | ICD-10-CM | POA: Insufficient documentation

## 2014-08-20 DIAGNOSIS — R51 Headache: Secondary | ICD-10-CM | POA: Diagnosis not present

## 2014-08-20 DIAGNOSIS — Y999 Unspecified external cause status: Secondary | ICD-10-CM | POA: Diagnosis not present

## 2014-08-20 DIAGNOSIS — Z79899 Other long term (current) drug therapy: Secondary | ICD-10-CM | POA: Insufficient documentation

## 2014-08-20 DIAGNOSIS — S199XXA Unspecified injury of neck, initial encounter: Secondary | ICD-10-CM | POA: Insufficient documentation

## 2014-08-20 DIAGNOSIS — Z862 Personal history of diseases of the blood and blood-forming organs and certain disorders involving the immune mechanism: Secondary | ICD-10-CM | POA: Insufficient documentation

## 2014-08-20 DIAGNOSIS — Z8673 Personal history of transient ischemic attack (TIA), and cerebral infarction without residual deficits: Secondary | ICD-10-CM | POA: Insufficient documentation

## 2014-08-20 NOTE — Discharge Instructions (Signed)

## 2014-08-20 NOTE — ED Provider Notes (Signed)
CSN: [id number]     Arrival date & time 08/20/14  5102 History   First MD Initiated Contact with Patient 08/20/14 [phone]     Chief Complaint  Patient presents with  . Fall     (Consider location/radiation/quality/duration/timing/severity/associated sxs/prior Treatment) Patient is a 81 y.o. female presenting with fall. The history is provided by the patient, the EMS personnel and the nursing home.  Fall This is a new problem. The current episode started today. Episode frequency: once. The problem has been resolved. Associated symptoms include neck pain. Pertinent negatives include no abdominal pain, chest pain, chills, coughing, diaphoresis, fatigue, headaches, nausea, numbness, rash, vertigo, visual change, vomiting or weakness. Nothing aggravates the symptoms. She has tried nothing for the symptoms. The treatment provided no relief.    Past Medical History  Diagnosis Date  . Hypertension   . GERD (gastroesophageal reflux disease)   . Palpitations   . Chronic fatigue   . Syncope 2007  . Vertebrobasilar TIAs   . Optic nerve disorder     right  . Nausea   . Arteriosclerotic heart disease   . DNR (do not resuscitate)   . Anemia   . Stroke     TIA  . Cataract    Past Surgical History  Procedure Laterality Date  . Rectal polypectomy    . Cataract extraction, bilateral    . Transthoracic echocardiogram  04/02/2005    EF 50-60%  . Esophageal dilitation    . Upper gastrointestinal endoscopy    . Colonoscopy    . Polypectomy    . Cholecystectomy     Family History  Problem Relation Age of Onset  . Heart disease Father   . Heart attack Father   . Heart disease Brother   . Heart disease Brother   . Heart disease Brother   . Heart disease Brother   . Heart disease Brother   . Heart attack Brother   . Heart attack Brother   . Heart attack Brother   . Colon cancer Neg Hx   . Rectal cancer Neg Hx   . Stomach cancer Neg Hx    History  Substance Use Topics  . Smoking status:  Never Smoker   . Smokeless tobacco: Never Used  . Alcohol Use: No   OB History    No data available     Review of Systems  Constitutional: Negative for chills, diaphoresis and fatigue.  Eyes: Negative for photophobia and visual disturbance.  Respiratory: Negative for cough, chest tightness and shortness of breath.   Cardiovascular: Negative for chest pain, palpitations and leg swelling.  Gastrointestinal: Negative for nausea, vomiting, abdominal pain, diarrhea and abdominal distention.  Genitourinary: Negative for dysuria and difficulty urinating.  Musculoskeletal: Positive for neck pain. Negative for back pain and neck stiffness.  Skin: Negative for color change, pallor, rash and wound.  Neurological: Negative for dizziness, vertigo, syncope, facial asymmetry, speech difficulty, weakness, light-headedness, numbness and headaches.  Psychiatric/Behavioral: Negative for confusion.  All other systems reviewed and are negative.     Allergies  Codeine and Mucinex  Home Medications   Prior to Admission medications   Medication Sig Start Date End Date Taking? Authorizing Provider  acetaminophen (TYLENOL) 325 MG tablet Take 325 mg by mouth every 6 (six) hours as needed (pain).   Yes Historical Provider, MD  atenolol (TENORMIN) 50 MG tablet Take 50 mg by mouth daily.   Yes Historical Provider, MD  BENEPROTEIN PO Take 1 scoop by mouth 3 (three) times daily.  Yes Historical Provider, MD  clopidogrel (PLAVIX) 75 MG tablet Take 1 tablet (75 mg total) by mouth daily. 03/11/12  Yes Peter M Martinique, MD  furosemide (LASIX) 20 MG tablet Take 10 mg by mouth daily.   Yes Historical Provider, MD  KLOR-CON M10 10 MEQ tablet Take 10 mEq by mouth daily.  04/09/14  Yes Historical Provider, MD  lisinopril (PRINIVIL,ZESTRIL) 10 MG tablet Take 10 mg by mouth daily.    Yes Historical Provider, MD  loperamide (IMODIUM A-D) 2 MG tablet Take 2 mg by mouth as needed for diarrhea or loose stools.   Yes Historical  Provider, MD  Melatonin 3 MG TABS Take 3 mg by mouth at bedtime.   Yes Historical Provider, MD  pantoprazole (PROTONIX) 20 MG tablet Take 20 mg by mouth daily.   Yes Historical Provider, MD  Zinc 50 MG CAPS Take 50 mg by mouth daily.   Yes Historical Provider, MD  NON FORMULARY Take 1 each by mouth 2 (two) times daily with breakfast and lunch. "house shake"    Historical Provider, MD  risperiDONE (RISPERDAL) 0.25 MG tablet Take 0.25 mg by mouth daily.    Historical Provider, MD   BP 135/57 mmHg  Pulse 73  Temp(Src) 97.6 F (36.4 C) (Oral)  Resp 12  SpO2 100% Physical Exam  Constitutional: She is oriented to person, place, and time. She appears well-developed and well-nourished. No distress.  HENT:  Head: Normocephalic and atraumatic.  Mouth/Throat: Oropharynx is clear and moist.  Eyes: Conjunctivae and EOM are normal. Pupils are equal, round, and reactive to light.  Neck:  c-collar in place  Cardiovascular: Normal rate, regular rhythm, normal heart sounds and intact distal pulses.  Exam reveals no gallop and no friction rub.   No murmur heard. Pulmonary/Chest: Effort normal and breath sounds normal. No respiratory distress. She has no wheezes. She has no rales. She exhibits no tenderness.  Abdominal: Soft. Bowel sounds are normal. She exhibits no distension. There is no tenderness. There is no rebound and no guarding.  Musculoskeletal:       Right ankle: She exhibits swelling. She exhibits normal range of motion, no deformity and normal pulse. Tenderness. Lateral malleolus tenderness found.       Cervical back: She exhibits tenderness. She exhibits no bony tenderness.       Thoracic back: She exhibits no tenderness and no bony tenderness.       Lumbar back: She exhibits tenderness (sacral TTP) and bony tenderness.  Neurological: She is alert and oriented to person, place, and time. She has normal strength. No cranial nerve deficit or sensory deficit. She exhibits normal muscle tone.  Coordination and gait normal. GCS eye subscore is 4. GCS verbal subscore is 5. GCS motor subscore is 6.  Skin: Skin is warm and dry. No rash noted. She is not diaphoretic. No erythema. No pallor.  Nursing note and vitals reviewed.   ED Course  Procedures (including critical care time) Labs Review Labs Reviewed - No data to display  Imaging Review No results found.   EKG Interpretation   Date/Time:  Monday Aug 20 2014 09:01:52 EDT Ventricular Rate:  76 PR Interval:  66 QRS Duration: 84 QT Interval:  403 QTC Calculation: 453 R Axis:   47 Text Interpretation:  Sinus rhythm Short PR interval Borderline low  voltage, extremity leads Nonspecific T abnormalities, anterior leads No  significant change since last tracing Confirmed by Winfred Leeds  MD, SAM  [phone]) on 08/20/2014 9:30:23 AM  MDM   Final diagnoses:  Fall, initial encounter    81 yo F presenting from ALF s/p fall from standing while on Plavix.  Pt states she took a step backward and lost her balance, falling backwards and hitting back of head. Pt states she recalls entire event.  Currently complains of sacral pain.  Spoke with Arlyn Leak, RN of [facility]- reports that pt told her she was going to get something from her room then come to breakfast- pt did not come right away so nurse went to check on her and found her on the floor on back.  Pt was alert and at mental status baseline when she found her, does not believe she lost consciousness.  Complained of pain to back of head and low back so sent to ED.  VSS en route.  Exam with TTP to cervical and sacral areas as well as R ankle.  Pt alert, oriented.  No other evidence of trauma.  Neuro intact. No other acute findings.  Plan for CT head, c-spine, XR pelvis and R ankle.  Imaging results show no evidence of trauma.  Pt ambulating with walker without difficulty- uses walker at baseline.  Stable for discharge.  ED return precautions given.  No other  concerns.  Discussed with attending Dr. Winfred Leeds.    Ellwood Dense, MD 08/23/14 [place], MD 08/24/14 [phone]

## 2014-08-20 NOTE — ED Notes (Signed)
To ED via [facility] from [facility] -- was walking across room and lost balance, falling backwards-- hitting head, c/o low back pain-- removed from board on arrival per Dr. Winfred Leeds. Pt is alert/oriented x 4, moves all extremities.

## 2014-08-20 NOTE — ED Notes (Signed)
Pt ambulated with stand by assist using walker. W/o distress.

## 2014-08-20 NOTE — ED Provider Notes (Signed)
Level V caveat dementia patient suffered ground-level fall while walking complains of pain at occipital area and presacral area as result fall. Treated by EMS with long board hard collar and CID. Pt alert and ambulates without difficulty at time of discharge.  Orlie Dakin, MD 08/20/14 [phone]

## 2014-10-28 ENCOUNTER — Encounter (HOSPITAL_COMMUNITY): Payer: Self-pay

## 2014-10-28 ENCOUNTER — Emergency Department (HOSPITAL_COMMUNITY): Payer: Medicare Other

## 2014-10-28 ENCOUNTER — Emergency Department (HOSPITAL_COMMUNITY)
Admission: EM | Admit: 2014-10-28 | Discharge: 2014-10-29 | Disposition: A | Discharge status: Skilled Nursing Facility | Payer: Medicare Other | Attending: Emergency Medicine | Admitting: Emergency Medicine

## 2014-10-28 DIAGNOSIS — K219 Gastro-esophageal reflux disease without esophagitis: Secondary | ICD-10-CM | POA: Insufficient documentation

## 2014-10-28 DIAGNOSIS — W19XXXA Unspecified fall, initial encounter: Secondary | ICD-10-CM

## 2014-10-28 DIAGNOSIS — Z23 Encounter for immunization: Secondary | ICD-10-CM | POA: Diagnosis not present

## 2014-10-28 DIAGNOSIS — T148 Other injury of unspecified body region: Secondary | ICD-10-CM | POA: Diagnosis not present

## 2014-10-28 DIAGNOSIS — D649 Anemia, unspecified: Secondary | ICD-10-CM | POA: Insufficient documentation

## 2014-10-28 DIAGNOSIS — S199XXA Unspecified injury of neck, initial encounter: Secondary | ICD-10-CM | POA: Diagnosis not present

## 2014-10-28 DIAGNOSIS — Z8669 Personal history of other diseases of the nervous system and sense organs: Secondary | ICD-10-CM | POA: Diagnosis not present

## 2014-10-28 DIAGNOSIS — E876 Hypokalemia: Secondary | ICD-10-CM | POA: Diagnosis not present

## 2014-10-28 DIAGNOSIS — Y92129 Unspecified place in nursing home as the place of occurrence of the external cause: Secondary | ICD-10-CM | POA: Diagnosis not present

## 2014-10-28 DIAGNOSIS — W1839XA Other fall on same level, initial encounter: Secondary | ICD-10-CM | POA: Insufficient documentation

## 2014-10-28 DIAGNOSIS — S0083XA Contusion of other part of head, initial encounter: Secondary | ICD-10-CM | POA: Insufficient documentation

## 2014-10-28 DIAGNOSIS — T148XXA Other injury of unspecified body region, initial encounter: Secondary | ICD-10-CM

## 2014-10-28 DIAGNOSIS — Y998 Other external cause status: Secondary | ICD-10-CM | POA: Diagnosis not present

## 2014-10-28 DIAGNOSIS — Z8673 Personal history of transient ischemic attack (TIA), and cerebral infarction without residual deficits: Secondary | ICD-10-CM | POA: Insufficient documentation

## 2014-10-28 DIAGNOSIS — H269 Unspecified cataract: Secondary | ICD-10-CM | POA: Insufficient documentation

## 2014-10-28 DIAGNOSIS — M542 Cervicalgia: Secondary | ICD-10-CM

## 2014-10-28 DIAGNOSIS — Y9389 Activity, other specified: Secondary | ICD-10-CM | POA: Insufficient documentation

## 2014-10-28 DIAGNOSIS — R002 Palpitations: Secondary | ICD-10-CM | POA: Diagnosis not present

## 2014-10-28 DIAGNOSIS — I10 Essential (primary) hypertension: Secondary | ICD-10-CM | POA: Insufficient documentation

## 2014-10-28 DIAGNOSIS — Z862 Personal history of diseases of the blood and blood-forming organs and certain disorders involving the immune mechanism: Secondary | ICD-10-CM | POA: Diagnosis not present

## 2014-10-28 DIAGNOSIS — Z79899 Other long term (current) drug therapy: Secondary | ICD-10-CM | POA: Diagnosis not present

## 2014-10-28 DIAGNOSIS — Z7902 Long term (current) use of antithrombotics/antiplatelets: Secondary | ICD-10-CM | POA: Insufficient documentation

## 2014-10-28 DIAGNOSIS — I2510 Atherosclerotic heart disease of native coronary artery without angina pectoris: Secondary | ICD-10-CM | POA: Insufficient documentation

## 2014-10-28 DIAGNOSIS — S80211A Abrasion, right knee, initial encounter: Secondary | ICD-10-CM | POA: Diagnosis not present

## 2014-10-28 LAB — I-STAT CHEM 8, ED
BUN: 12 mg/dL (ref 6–20)
Calcium, Ion: 1.11 mmol/L — ABNORMAL LOW (ref 1.13–1.30)
Chloride: 96 mmol/L — ABNORMAL LOW (ref 101–111)
Creatinine, Ser: 0.8 mg/dL (ref 0.44–1.00)
Glucose, Bld: 115 mg/dL — ABNORMAL HIGH (ref 65–99)
HEMATOCRIT: 36 % (ref 36.0–46.0)
Hemoglobin: 12.2 g/dL (ref 12.0–15.0)
POTASSIUM: 2.8 mmol/L — AB (ref 3.5–5.1)
SODIUM: 143 mmol/L (ref 135–145)
TCO2: 30 mmol/L (ref 0–100)

## 2014-10-28 MED ORDER — OXYCODONE HCL 5 MG PO TABS: 2.5000 mg | ORAL_TABLET | Freq: Once | ORAL | Status: DC

## 2014-10-28 MED ORDER — TETANUS-DIPHTH-ACELL PERTUSSIS 5-2.5-18.5 LF-MCG/0.5 IM SUSP
0.5000 mL | Freq: Once | INTRAMUSCULAR | Status: AC
  Administered 2014-10-28: 0.5 mL via INTRAMUSCULAR
  Filled 2014-10-28: qty 0.5

## 2014-10-28 MED ORDER — POTASSIUM CHLORIDE CRYS ER 20 MEQ PO TBCR
40.0000 meq | EXTENDED_RELEASE_TABLET | ORAL | Status: AC
  Administered 2014-10-28: 40 meq via ORAL
  Filled 2014-10-28: qty 2

## 2014-10-28 MED ORDER — ACETAMINOPHEN 325 MG PO TABS
650.0000 mg | ORAL_TABLET | Freq: Once | ORAL | Status: AC
  Administered 2014-10-28: 650 mg via ORAL
  Filled 2014-10-28: qty 2

## 2014-10-28 NOTE — Discharge Instructions (Signed)
Your potassium was low today, you need it rechecked in one week.  Eat two bananas a day until that recheck.  Fall Prevention and Home Safety Falls cause injuries and can affect all age groups. It is possible to prevent falls.  HOW TO PREVENT FALLS  Wear shoes with rubber soles that do not have an opening for your toes.  Keep the inside and outside of your house well lit.  Use night lights throughout your home.  Remove clutter from floors.  Clean up floor spills.  Remove throw rugs or fasten them to the floor with carpet tape.  Do not place electrical cords across pathways.  Put grab bars by your tub, shower, and toilet. Do not use towel bars as grab bars.  Put handrails on both sides of the stairway. Fix loose handrails.  Do not climb on stools or stepladders, if possible.  Do not wax your floors.  Repair uneven or unsafe sidewalks, walkways, or stairs.  Keep items you use a lot within reach.  Be aware of pets.  Keep emergency numbers next to the telephone.  Put smoke detectors in your home and near bedrooms. Ask your doctor what other things you can do to prevent falls. Document Released: 01/10/2009 Document Revised: 09/15/2011 Document Reviewed: 06/16/2011 [facility] Patient Information 2015 [place], [place]. This information is not intended to replace advice given to you by your health care provider. Make sure you discuss any questions you have with your health care provider.

## 2014-10-28 NOTE — ED Notes (Signed)
Pt had an unwitnessed fall at her facility from standing, she complains of head pain. She has a hematoma on head, she complains of elbow, hip and knee pain, her head is bleeding per EMS, she also has a hematoma to her jaw

## 2014-10-28 NOTE — ED Notes (Signed)
Bed: WA18 Expected date:  Expected time:  Means of arrival:  Comments: EMS 

## 2014-10-28 NOTE — ED Notes (Signed)
Pt from [facility] [employer] With a unwitnessed fall. Hematoma to right eye with swelling almost completely shut. Skin tear to right knee and hematoma to right lower jaw. Pt alert and oriented. Pt takes plavix. Neuro assessment is normal.

## 2014-10-28 NOTE — ED Provider Notes (Signed)
CSN: [id number]     Arrival date & time 10/28/14  1953 History   First MD Initiated Contact with Patient 10/28/14 2029     Chief Complaint  Patient presents with  . Fall     (Consider location/radiation/quality/duration/timing/severity/associated sxs/prior Treatment) Patient is a 81 y.o. female presenting with fall. The history is provided by the patient.  Fall This is a recurrent problem. The current episode started 3 to 5 hours ago. The problem occurs constantly. The problem has been resolved. Pertinent negatives include no chest pain, no headaches and no shortness of breath. Nothing aggravates the symptoms. Nothing relieves the symptoms. She has tried nothing for the symptoms. The treatment provided no relief.   81 yo F with a chief complaint of a fall. Family states that this is a been a reoccurring problem. Patient has had multiple falls over the past year. Is generally unsteady on her feet. Patient feels that she stumbled and fell today. Landed on her face. Patient on Plavix. Patient states that she has pain to her face and her jaw. His other areas of pain specifically denies neck pain back pain abdominal pain chest pain.  Past Medical History  Diagnosis Date  . Hypertension   . GERD (gastroesophageal reflux disease)   . Palpitations   . Chronic fatigue   . Syncope 2007  . Vertebrobasilar TIAs   . Optic nerve disorder     right  . Nausea   . Arteriosclerotic heart disease   . DNR (do not resuscitate)   . Anemia   . Stroke     TIA  . Cataract    Past Surgical History  Procedure Laterality Date  . Rectal polypectomy    . Cataract extraction, bilateral    . Transthoracic echocardiogram  04/02/2005    EF 50-60%  . Esophageal dilitation    . Upper gastrointestinal endoscopy    . Colonoscopy    . Polypectomy    . Cholecystectomy     Family History  Problem Relation Age of Onset  . Heart disease Father   . Heart attack Father   . Heart disease Brother   . Heart disease  Brother   . Heart disease Brother   . Heart disease Brother   . Heart disease Brother   . Heart attack Brother   . Heart attack Brother   . Heart attack Brother   . Colon cancer Neg Hx   . Rectal cancer Neg Hx   . Stomach cancer Neg Hx    History  Substance Use Topics  . Smoking status: Never Smoker   . Smokeless tobacco: Never Used  . Alcohol Use: No   OB History    No data available     Review of Systems  Constitutional: Negative for fever and chills.  HENT: Positive for facial swelling. Negative for congestion and rhinorrhea.   Eyes: Negative for redness and visual disturbance.  Respiratory: Negative for shortness of breath and wheezing.   Cardiovascular: Negative for chest pain and palpitations.  Gastrointestinal: Negative for nausea and vomiting.  Genitourinary: Negative for dysuria and urgency.  Musculoskeletal: Positive for myalgias and arthralgias.  Skin: Negative for pallor and wound.  Neurological: Negative for dizziness and headaches.      Allergies  Codeine and Mucinex  Home Medications   Prior to Admission medications   Medication Sig Start Date End Date Taking? Authorizing Provider  acetaminophen (TYLENOL) 325 MG tablet Take 325 mg by mouth every 6 (six) hours as needed (pain).  Yes Historical Provider, MD  atenolol (TENORMIN) 50 MG tablet Take 50 mg by mouth daily.   Yes Historical Provider, MD  clopidogrel (PLAVIX) 75 MG tablet Take 1 tablet (75 mg total) by mouth daily. 03/11/12  Yes Peter M Martinique, MD  furosemide (LASIX) 20 MG tablet Take 20 mg by mouth daily.    Yes Historical Provider, MD  KLOR-CON M10 10 MEQ tablet Take 10 mEq by mouth daily.  04/09/14  Yes Historical Provider, MD  lisinopril (PRINIVIL,ZESTRIL) 10 MG tablet Take 10 mg by mouth daily.    Yes Historical Provider, MD  Melatonin 3 MG TABS Take 3 mg by mouth at bedtime.   Yes Historical Provider, MD  omeprazole (PRILOSEC) 20 MG capsule Take 20 mg by mouth daily. 08/29/14  Yes Historical  Provider, MD  pantoprazole (PROTONIX) 20 MG tablet Take 20 mg by mouth daily.   Yes Historical Provider, MD  ranitidine (ZANTAC) 75 MG tablet Take 75 mg by mouth 2 (two) times daily.   Yes Historical Provider, MD  sertraline (ZOLOFT) 25 MG tablet Take 12.5 mg by mouth daily. 10/17/14  Yes Historical Provider, MD  Zinc 50 MG CAPS Take 50 mg by mouth daily.   Yes Historical Provider, MD   BP 139/51 mmHg  Pulse 61  Temp(Src) 97.9 F (36.6 C) (Oral)  Resp 18  SpO2 99% Physical Exam  Constitutional: She is oriented to person, place, and time. She appears well-developed and well-nourished. No distress.  HENT:  Head: Normocephalic and atraumatic.    Eyes: EOM are normal. Pupils are equal, round, and reactive to light.  Neck: Normal range of motion. Neck supple.  Cardiovascular: Normal rate and regular rhythm.  Exam reveals no gallop and no friction rub.   No murmur heard. Pulmonary/Chest: Effort normal. She has no wheezes. She has no rales.  Abdominal: Soft. She exhibits no distension. There is no tenderness.  Musculoskeletal: She exhibits no edema or tenderness.  Neurological: She is alert and oriented to person, place, and time.  Skin: Skin is warm and dry. She is not diaphoretic.     Psychiatric: She has a normal mood and affect. Her behavior is normal.    ED Course  Procedures (including critical care time) Labs Review Labs Reviewed  I-STAT CHEM 8, ED - Abnormal; Notable for the following:    Potassium 2.8 (*)    Chloride 96 (*)    Glucose, Bld 115 (*)    Calcium, Ion 1.11 (*)    All other components within normal limits    Imaging Review Ct Head Wo Contrast  10/28/2014   CLINICAL DATA:  Fall  EXAM: CT HEAD WITHOUT CONTRAST  CT MAXILLOFACIAL WITHOUT CONTRAST  CT CERVICAL SPINE WITHOUT CONTRAST  TECHNIQUE: Multidetector CT imaging of the head, cervical spine, and maxillofacial structures were performed using the standard protocol without intravenous contrast. Multiplanar CT  image reconstructions of the cervical spine and maxillofacial structures were also generated.  COMPARISON:  08/20/2014  FINDINGS: CT HEAD FINDINGS  There is no intracranial hemorrhage, mass or evidence of acute infarction. There is mild generalized atrophy. There is mild chronic microvascular ischemic change. There is no significant extra-axial fluid collection.  No acute intracranial findings are evident. Calvarium and skullbase are intact.  CT MAXILLOFACIAL FINDINGS  There is right periorbital soft tissue swelling. The ocular globes are intact. No maxillofacial fracture is evident. Nasal bones are intact. Orbits are intact. Orbital floors are intact. Maxillary sinuses are intact. Pterygoid plates and zygomatic arches are intact.  Mandible and TMJ are intact.  CT CERVICAL SPINE FINDINGS  The vertebral column, pedicles and facet articulations are intact. There is no evidence of acute fracture. No acute soft tissue abnormalities are evident.  Moderate degenerative disc changes are present, greatest at C5-6 and C6-7. There also is cervical facet arthritis, greatest on the left from C2 through C4.  IMPRESSION: 1. Negative for acute intracranial traumatic injury 2. Mild generalized brain atrophy and chronic microvascular ischemic changes. 3. Negative for acute maxillofacial fracture. There is right periorbital soft tissue swelling. 4. Negative for acute cervical spine fracture.   Electronically Signed   By: Andreas Newport M.D.   On: 10/28/2014 21:11   Ct Cervical Spine Wo Contrast  10/28/2014   CLINICAL DATA:  Fall  EXAM: CT HEAD WITHOUT CONTRAST  CT MAXILLOFACIAL WITHOUT CONTRAST  CT CERVICAL SPINE WITHOUT CONTRAST  TECHNIQUE: Multidetector CT imaging of the head, cervical spine, and maxillofacial structures were performed using the standard protocol without intravenous contrast. Multiplanar CT image reconstructions of the cervical spine and maxillofacial structures were also generated.  COMPARISON:  08/20/2014   FINDINGS: CT HEAD FINDINGS  There is no intracranial hemorrhage, mass or evidence of acute infarction. There is mild generalized atrophy. There is mild chronic microvascular ischemic change. There is no significant extra-axial fluid collection.  No acute intracranial findings are evident. Calvarium and skullbase are intact.  CT MAXILLOFACIAL FINDINGS  There is right periorbital soft tissue swelling. The ocular globes are intact. No maxillofacial fracture is evident. Nasal bones are intact. Orbits are intact. Orbital floors are intact. Maxillary sinuses are intact. Pterygoid plates and zygomatic arches are intact. Mandible and TMJ are intact.  CT CERVICAL SPINE FINDINGS  The vertebral column, pedicles and facet articulations are intact. There is no evidence of acute fracture. No acute soft tissue abnormalities are evident.  Moderate degenerative disc changes are present, greatest at C5-6 and C6-7. There also is cervical facet arthritis, greatest on the left from C2 through C4.  IMPRESSION: 1. Negative for acute intracranial traumatic injury 2. Mild generalized brain atrophy and chronic microvascular ischemic changes. 3. Negative for acute maxillofacial fracture. There is right periorbital soft tissue swelling. 4. Negative for acute cervical spine fracture.   Electronically Signed   By: Andreas Newport M.D.   On: 10/28/2014 21:11   Ct Maxillofacial Wo Cm  10/28/2014   CLINICAL DATA:  Fall  EXAM: CT HEAD WITHOUT CONTRAST  CT MAXILLOFACIAL WITHOUT CONTRAST  CT CERVICAL SPINE WITHOUT CONTRAST  TECHNIQUE: Multidetector CT imaging of the head, cervical spine, and maxillofacial structures were performed using the standard protocol without intravenous contrast. Multiplanar CT image reconstructions of the cervical spine and maxillofacial structures were also generated.  COMPARISON:  08/20/2014  FINDINGS: CT HEAD FINDINGS  There is no intracranial hemorrhage, mass or evidence of acute infarction. There is mild generalized  atrophy. There is mild chronic microvascular ischemic change. There is no significant extra-axial fluid collection.  No acute intracranial findings are evident. Calvarium and skullbase are intact.  CT MAXILLOFACIAL FINDINGS  There is right periorbital soft tissue swelling. The ocular globes are intact. No maxillofacial fracture is evident. Nasal bones are intact. Orbits are intact. Orbital floors are intact. Maxillary sinuses are intact. Pterygoid plates and zygomatic arches are intact. Mandible and TMJ are intact.  CT CERVICAL SPINE FINDINGS  The vertebral column, pedicles and facet articulations are intact. There is no evidence of acute fracture. No acute soft tissue abnormalities are evident.  Moderate degenerative disc changes are present,  greatest at C5-6 and C6-7. There also is cervical facet arthritis, greatest on the left from C2 through C4.  IMPRESSION: 1. Negative for acute intracranial traumatic injury 2. Mild generalized brain atrophy and chronic microvascular ischemic changes. 3. Negative for acute maxillofacial fracture. There is right periorbital soft tissue swelling. 4. Negative for acute cervical spine fracture.   Electronically Signed   By: Andreas Newport M.D.   On: 10/28/2014 21:11     EKG Interpretation   Date/Time:  Sunday October 28 2014 21:08:59 EDT Ventricular Rate:  61 PR Interval:  174 QRS Duration: 82 QT Interval:  434 QTC Calculation: 437 R Axis:   40 Text Interpretation:  Sinus rhythm Nonspecific T abnormalities, anterior  leads Baseline wander in lead(s) V2 No significant change was found  Confirmed by Dejanira Pamintuan MD, DANIEL [phone]) on 10/28/2014 10:27:20 PM      MDM   Final diagnoses:  Cervical spine pain  Fall, initial encounter  Avulsion, skin  Hypokalemia    81 yo F with a chief complaint of a fall. CT head C-spine and face negative for acute injury. Patient found to be hypokalemic without EKG changes. We'll give oral potassium  Recheck at PCP in 1  week. 11:09 PM:  I have discussed the diagnosis/risks/treatment options with the patient and family and believe the pt to be eligible for discharge home to follow-up with PCP. We also discussed returning to the ED immediately if new or worsening sx occur. We discussed the sx which are most concerning (e.g., increasing weakness, sudden headache or stroke symptoms) that necessitate immediate return. Medications administered to the patient during their visit and any new prescriptions provided to the patient are listed below.  Medications given during this visit Medications  oxyCODONE (Oxy IR/ROXICODONE) immediate release tablet 2.5 mg (2.5 mg Oral Not Given 10/28/14 2120)  potassium chloride SA (K-DUR,KLOR-CON) CR tablet 40 mEq (not administered)  Tdap (BOOSTRIX) injection 0.5 mL (not administered)  acetaminophen (TYLENOL) tablet 650 mg (650 mg Oral Given 10/28/14 2131)    New Prescriptions   No medications on file     The patient appears reasonably screen and/or stabilized for discharge and I doubt any other medical condition or other [facility] requiring further screening, evaluation, or treatment in the ED at this time prior to discharge.      Deno Etienne, DO 10/28/14 2309

## 2014-10-29 NOTE — ED Notes (Signed)
PTAR has been called  

## 2014-10-29 NOTE — ED Notes (Signed)
PTAR at bedside 

## 2014-11-23 ENCOUNTER — Emergency Department (HOSPITAL_COMMUNITY): Payer: Medicare Other

## 2014-11-23 ENCOUNTER — Emergency Department (HOSPITAL_COMMUNITY)
Admission: EM | Admit: 2014-11-23 | Discharge: 2014-11-23 | Disposition: A | Discharge status: Home / Self Care | Payer: Medicare Other | Attending: Emergency Medicine | Admitting: Emergency Medicine

## 2014-11-23 ENCOUNTER — Encounter (HOSPITAL_COMMUNITY): Payer: Self-pay

## 2014-11-23 DIAGNOSIS — I2510 Atherosclerotic heart disease of native coronary artery without angina pectoris: Secondary | ICD-10-CM | POA: Diagnosis not present

## 2014-11-23 DIAGNOSIS — S0101XA Laceration without foreign body of scalp, initial encounter: Secondary | ICD-10-CM

## 2014-11-23 DIAGNOSIS — W1839XA Other fall on same level, initial encounter: Secondary | ICD-10-CM | POA: Diagnosis not present

## 2014-11-23 DIAGNOSIS — Z7902 Long term (current) use of antithrombotics/antiplatelets: Secondary | ICD-10-CM | POA: Diagnosis not present

## 2014-11-23 DIAGNOSIS — T148XXA Other injury of unspecified body region, initial encounter: Secondary | ICD-10-CM

## 2014-11-23 DIAGNOSIS — Z8669 Personal history of other diseases of the nervous system and sense organs: Secondary | ICD-10-CM | POA: Insufficient documentation

## 2014-11-23 DIAGNOSIS — W19XXXA Unspecified fall, initial encounter: Secondary | ICD-10-CM

## 2014-11-23 DIAGNOSIS — Z862 Personal history of diseases of the blood and blood-forming organs and certain disorders involving the immune mechanism: Secondary | ICD-10-CM | POA: Insufficient documentation

## 2014-11-23 DIAGNOSIS — S0990XA Unspecified injury of head, initial encounter: Secondary | ICD-10-CM | POA: Diagnosis present

## 2014-11-23 DIAGNOSIS — Z8673 Personal history of transient ischemic attack (TIA), and cerebral infarction without residual deficits: Secondary | ICD-10-CM | POA: Insufficient documentation

## 2014-11-23 DIAGNOSIS — Y92129 Unspecified place in nursing home as the place of occurrence of the external cause: Secondary | ICD-10-CM | POA: Diagnosis not present

## 2014-11-23 DIAGNOSIS — F0390 Unspecified dementia without behavioral disturbance: Secondary | ICD-10-CM | POA: Insufficient documentation

## 2014-11-23 DIAGNOSIS — Y998 Other external cause status: Secondary | ICD-10-CM | POA: Diagnosis not present

## 2014-11-23 DIAGNOSIS — Y9389 Activity, other specified: Secondary | ICD-10-CM | POA: Insufficient documentation

## 2014-11-23 DIAGNOSIS — I10 Essential (primary) hypertension: Secondary | ICD-10-CM | POA: Insufficient documentation

## 2014-11-23 DIAGNOSIS — Z79899 Other long term (current) drug therapy: Secondary | ICD-10-CM | POA: Insufficient documentation

## 2014-11-23 DIAGNOSIS — K219 Gastro-esophageal reflux disease without esophagitis: Secondary | ICD-10-CM | POA: Diagnosis not present

## 2014-11-23 LAB — URINALYSIS, ROUTINE W REFLEX MICROSCOPIC
GLUCOSE, UA: NEGATIVE mg/dL
HGB URINE DIPSTICK: NEGATIVE
Ketones, ur: NEGATIVE mg/dL
Leukocytes, UA: NEGATIVE
Nitrite: NEGATIVE
PROTEIN: NEGATIVE mg/dL
Specific Gravity, Urine: 1.022 (ref 1.005–1.030)
Urobilinogen, UA: 1 mg/dL (ref 0.0–1.0)
pH: 6.5 (ref 5.0–8.0)

## 2014-11-23 MED ORDER — ACETAMINOPHEN 325 MG PO TABS
650.0000 mg | ORAL_TABLET | Freq: Once | ORAL | Status: AC
  Administered 2014-11-23: 650 mg via ORAL
  Filled 2014-11-23: qty 2

## 2014-11-23 MED ORDER — ACETAMINOPHEN ER 650 MG PO TBCR: 650.0000 mg | EXTENDED_RELEASE_TABLET | Freq: Three times a day (TID) | ORAL | Status: DC | PRN

## 2014-11-23 NOTE — ED Notes (Signed)
Bed: FM40 Expected date:  Expected time:  Means of arrival:  Comments: EMS-fall/head lac

## 2014-11-23 NOTE — ED Notes (Signed)
Patient transported to X-ray 

## 2014-11-23 NOTE — ED Provider Notes (Signed)
CSN: [id number]     Arrival date & time 11/23/14  1002 History   First MD Initiated Contact with Patient 11/23/14 1007     Chief Complaint  Patient presents with  . Fall  . Head Injury     (Consider location/radiation/quality/duration/timing/severity/associated sxs/prior Treatment) HPI Comments: Pt comes in post fall. Pt had a mechanical, witnessed fall at the nursing home. Pt has active scalp bleeding. There is no LOC. Pt has severe dementia and unable to provide a lot of meaningful history, besides stating that her head hurts.   Patient is a 81 y.o. female presenting with fall and head injury. The history is provided by the patient.  Fall This is a new problem. The current episode started less than 1 hour ago.  Head Injury Location:  L parietal Time since incident:  30 minutes Mechanism of injury: fall   Chronicity:  New   Past Medical History  Diagnosis Date  . Hypertension   . GERD (gastroesophageal reflux disease)   . Palpitations   . Chronic fatigue   . Syncope 2007  . Vertebrobasilar TIAs   . Optic nerve disorder     right  . Nausea   . Arteriosclerotic heart disease   . DNR (do not resuscitate)   . Anemia   . Stroke     TIA  . Cataract    Past Surgical History  Procedure Laterality Date  . Rectal polypectomy    . Cataract extraction, bilateral    . Transthoracic echocardiogram  04/02/2005    EF 50-60%  . Esophageal dilitation    . Upper gastrointestinal endoscopy    . Colonoscopy    . Polypectomy    . Cholecystectomy     Family History  Problem Relation Age of Onset  . Heart disease Father   . Heart attack Father   . Heart disease Brother   . Heart disease Brother   . Heart disease Brother   . Heart disease Brother   . Heart disease Brother   . Heart attack Brother   . Heart attack Brother   . Heart attack Brother   . Colon cancer Neg Hx   . Rectal cancer Neg Hx   . Stomach cancer Neg Hx    Social History  Substance Use Topics  . Smoking  status: Never Smoker   . Smokeless tobacco: Never Used  . Alcohol Use: No   OB History    No data available     Review of Systems  Unable to perform ROS: Dementia  Skin: Positive for wound.      Allergies  Codeine and Mucinex  Home Medications   Prior to Admission medications   Medication Sig Start Date End Date Taking? Authorizing Provider  atenolol (TENORMIN) 50 MG tablet Take 50 mg by mouth daily.   Yes Historical Provider, MD  clopidogrel (PLAVIX) 75 MG tablet Take 1 tablet (75 mg total) by mouth daily. 03/11/12  Yes Peter M Martinique, MD  furosemide (LASIX) 20 MG tablet Take 20 mg by mouth daily.    Yes Historical Provider, MD  KLOR-CON M10 10 MEQ tablet Take 10 mEq by mouth daily.  04/09/14  Yes Historical Provider, MD  lisinopril (PRINIVIL,ZESTRIL) 10 MG tablet Take 10 mg by mouth daily.    Yes Historical Provider, MD  Melatonin 3 MG TABS Take 3 mg by mouth at bedtime.   Yes Historical Provider, MD  Nutritional Supplements (ENSURE PO) Take 1 Bottle by mouth 3 (three) times daily. Take  1 shake 900,1700,and 2100   Yes Historical Provider, MD  pantoprazole (PROTONIX) 20 MG tablet Take 20 mg by mouth daily.   Yes Historical Provider, MD  ranitidine (ZANTAC) 75 MG tablet Take 75 mg by mouth 2 (two) times daily.   Yes Historical Provider, MD  sertraline (ZOLOFT) 25 MG tablet Take 12.5 mg by mouth daily. 10/17/14  Yes Historical Provider, MD  Zinc 50 MG CAPS Take 50 mg by mouth daily.   Yes Historical Provider, MD  acetaminophen (TYLENOL 8 HOUR) 650 MG CR tablet Take 1 tablet (650 mg total) by mouth every 8 (eight) hours as needed for pain. 11/23/14   Jonathan Kirkendoll Kathrynn Humble, MD   BP 129/64 mmHg  Pulse 83  Temp(Src) 97.1 F (36.2 C) (Oral)  Resp 18  SpO2 99% Physical Exam  Constitutional: She is oriented to person, place, and time. She appears well-developed and well-nourished.  HENT:  Head: Normocephalic and atraumatic.  Eyes: EOM are normal. Pupils are equal, round, and reactive to  light.  Neck: Neck supple.  Cardiovascular: Normal rate and regular rhythm.   No murmur heard. Pulmonary/Chest: Effort normal. No respiratory distress.  Abdominal: Soft. She exhibits no distension. There is no tenderness. There is no rebound and no guarding.  Musculoskeletal:  PATIENT HAS A 6 CM LACERATION, irregular.  Otherwise:  Head to toe evaluation shows no hematoma, bleeding of the scalp, no facial abrasions, step offs, crepitus, no tenderness to palpation of the bilateral upper and lower extremities, no gross deformities, no chest tenderness, no pelvic pain.   Neurological: She is alert and oriented to person, place, and time.  Skin: Skin is warm and dry.  Nursing note and vitals reviewed.   ED Course  Procedures (including critical care time)  LACERATION REPAIR Performed by: Varney Biles Authorized by: Varney Biles Consent: Verbal consent obtained. Risks and benefits: risks, benefits and alternatives were discussed Consent given by: patient Patient identity confirmed: provided demographic data Prepped and Draped in normal sterile fashion Wound explored  Laceration Location: Left parietal scalp  Laceration Length: 6 cm  No Foreign Bodies seen or palpated  No anesthesia   Irrigation method: syringe Amount of cleaning: standard  Skin closure: PRIMARY  Number of sutures: STAPLES - 5  Technique: STAPLES  Patient tolerance: Patient tolerated the procedure well with no immediate complications.     Labs Review  Labs Reviewed  URINALYSIS, ROUTINE W REFLEX MICROSCOPIC (NOT AT [facility]) - Abnormal; Notable for the following:    Bilirubin Urine SMALL (*)    All other components within normal limits  URINE CULTURE    Imaging Review Dg Pelvis 1-2 Views  11/23/2014   CLINICAL DATA:  81 year old female with fall this morning and pelvic pain. Initial encounter.  EXAM: PELVIS - 1-2 VIEW  COMPARISON:  08/20/2014  FINDINGS: There is no evidence of fracture,  subluxation or dislocation.  Degenerative changes of the hips and lower lumbar spine are again noted.  No focal bony lesions are identified.  IMPRESSION: No acute abnormalities.   Electronically Signed   By: Margarette Canada M.D.   On: 11/23/2014 10:52   Ct Head Wo Contrast  11/23/2014   CLINICAL DATA:  Head injury/laceration after stumbling fall this morning. Patient hit head.  EXAM: CT HEAD WITHOUT CONTRAST  CT CERVICAL SPINE WITHOUT CONTRAST  TECHNIQUE: Multidetector CT imaging of the head and cervical spine was performed following the standard protocol without intravenous contrast. Multiplanar CT image reconstructions of the cervical spine were also generated.  COMPARISON:  None.  FINDINGS: CT HEAD FINDINGS  There is mild generalized brain atrophy with commensurate dilatation of the ventricles and sulci. Mild chronic small vessel ischemic change noted within the deep periventricular white matter. All other areas of the brain demonstrate normal gray-white matter differentiation.  No mass, hemorrhage, edema, or other evidence of acute parenchymal abnormality. No extra-axial hemorrhage. There is focal soft tissue edema/ hematoma overlying the left frontoparietal bone with associated staples. No underlying osseous fracture or displacement.  CT CERVICAL SPINE FINDINGS  There is moderate degenerative change within the mid to lower cervical spine as manifested by disc space narrowings and mild osseous spurring. There is no more than mild central canal stenosis at any level. There is slight reversal of the normal cervical lordosis likely related to these underlying degenerative changes. No fracture line or displaced fracture fragment identified. Facet joints are well aligned throughout.  Atherosclerotic calcifications are seen at each carotid bulb region. Paravertebral soft tissues are otherwise unremarkable. Scarring/fibrosis incidentally noted at each lung apex. Visualized facial bones appear intact and well aligned.   IMPRESSION: 1. Focal soft tissue edema/hematoma overlying the left frontoparietal bone, with associated staples. No underlying osseous fracture or displacement. 2. No evidence of acute intracranial abnormality. No intracranial hemorrhage or edema. 3. Degenerative changes within the cervical spine as detailed above. No fracture or acute subluxation identified within the cervical spine.   Electronically Signed   By: Franki Cabot M.D.   On: 11/23/2014 11:08   Ct Cervical Spine Wo Contrast  11/23/2014   CLINICAL DATA:  Head injury/laceration after stumbling fall this morning. Patient hit head.  EXAM: CT HEAD WITHOUT CONTRAST  CT CERVICAL SPINE WITHOUT CONTRAST  TECHNIQUE: Multidetector CT imaging of the head and cervical spine was performed following the standard protocol without intravenous contrast. Multiplanar CT image reconstructions of the cervical spine were also generated.  COMPARISON:  None.  FINDINGS: CT HEAD FINDINGS  There is mild generalized brain atrophy with commensurate dilatation of the ventricles and sulci. Mild chronic small vessel ischemic change noted within the deep periventricular white matter. All other areas of the brain demonstrate normal gray-white matter differentiation.  No mass, hemorrhage, edema, or other evidence of acute parenchymal abnormality. No extra-axial hemorrhage. There is focal soft tissue edema/ hematoma overlying the left frontoparietal bone with associated staples. No underlying osseous fracture or displacement.  CT CERVICAL SPINE FINDINGS  There is moderate degenerative change within the mid to lower cervical spine as manifested by disc space narrowings and mild osseous spurring. There is no more than mild central canal stenosis at any level. There is slight reversal of the normal cervical lordosis likely related to these underlying degenerative changes. No fracture line or displaced fracture fragment identified. Facet joints are well aligned throughout.  Atherosclerotic  calcifications are seen at each carotid bulb region. Paravertebral soft tissues are otherwise unremarkable. Scarring/fibrosis incidentally noted at each lung apex. Visualized facial bones appear intact and well aligned.  IMPRESSION: 1. Focal soft tissue edema/hematoma overlying the left frontoparietal bone, with associated staples. No underlying osseous fracture or displacement. 2. No evidence of acute intracranial abnormality. No intracranial hemorrhage or edema. 3. Degenerative changes within the cervical spine as detailed above. No fracture or acute subluxation identified within the cervical spine.   Electronically Signed   By: Franki Cabot M.D.   On: 11/23/2014 11:08   I have personally reviewed and evaluated these images and lab results as part of my medical decision-making.   EKG Interpretation None  MDM   Final diagnoses:  Scalp laceration, initial encounter  Fall, initial encounter  Hematoma    Pt comes in post fall.  DDx includes: - Mechanical falls - ICH - Fractures - Contusions - Soft tissue injury   She has a scalp laceration, which we had to staple. There is a hematoma. Daughter at bedside aware of the findings. She is UTD with tetanus.  WE will ambulate and if she is able to do so - d/c.  Varney Biles, MD 11/23/14 1250

## 2014-11-23 NOTE — Discharge Instructions (Signed)
We saw you in the ER after you had a fall. All the imaging results are normal, no fractures seen. No evidence of brain bleed. Please be very careful with walking, and do everything possible to prevent falls.  Staples will need to be removed in 10-14 days, and primary care doctor should be able to get them out - if not you may go to any urgent care or ER (ER are expensive).   Contusion A contusion is a deep bruise. Contusions are the result of an injury that caused bleeding under the skin. The contusion may turn blue, purple, or yellow. Minor injuries will give you a painless contusion, but more severe contusions may stay painful and swollen for a few weeks.  CAUSES  A contusion is usually caused by a blow, trauma, or direct force to an area of the body. SYMPTOMS   Swelling and redness of the injured area.  Bruising of the injured area.  Tenderness and soreness of the injured area.  Pain. DIAGNOSIS  The diagnosis can be made by taking a history and physical exam. An X-ray, CT scan, or MRI may be needed to determine if there were any associated injuries, such as fractures. TREATMENT  Specific treatment will depend on what area of the body was injured. In general, the best treatment for a contusion is resting, icing, elevating, and applying cold compresses to the injured area. Over-the-counter medicines may also be recommended for pain control. Ask your caregiver what the best treatment is for your contusion. HOME CARE INSTRUCTIONS   Put ice on the injured area.  Put ice in a plastic bag.  Place a towel between your skin and the bag.  Leave the ice on for 15-20 minutes, 3-4 times a day, or as directed by your health care provider.  Only take over-the-counter or prescription medicines for pain, discomfort, or fever as directed by your caregiver. Your caregiver may recommend avoiding anti-inflammatory medicines (aspirin, ibuprofen, and naproxen) for 48 hours because these medicines may  increase bruising.  Rest the injured area.  If possible, elevate the injured area to reduce swelling. SEEK IMMEDIATE MEDICAL CARE IF:   You have increased bruising or swelling.  You have pain that is getting worse.  Your swelling or pain is not relieved with medicines. MAKE SURE YOU:   Understand these instructions.  Will watch your condition.  Will get help right away if you are not doing well or get worse. Document Released: 12/24/2004 Document Revised: 03/21/2013 Document Reviewed: 01/19/2011 [facility] Patient Information 2015 [place], [place]. This information is not intended to replace advice given to you by your health care provider. Make sure you discuss any questions you have with your health care provider.  Hematoma A hematoma is a collection of blood under the skin, in an organ, in a body space, in a joint space, or in other tissue. The blood can clot to form a lump that you can see and feel. The lump is often firm and may sometimes become sore and tender. Most hematomas get better in a few days to weeks. However, some hematomas may be serious and require medical care. Hematomas can range in size from very small to very large. CAUSES  A hematoma can be caused by a blunt or penetrating injury. It can also be caused by spontaneous leakage from a blood vessel under the skin. Spontaneous leakage from a blood vessel is more likely to occur in older people, especially those taking blood thinners. Sometimes, a hematoma can develop  after certain medical procedures. SIGNS AND SYMPTOMS   A firm lump on the body.  Possible pain and tenderness in the area.  Bruising.Blue, dark blue, purple-red, or yellowish skin may appear at the site of the hematoma if the hematoma is close to the surface of the skin. For hematomas in deeper tissues or body spaces, the signs and symptoms may be subtle. For example, an intra-abdominal hematoma may cause abdominal pain, weakness, fainting, and shortness  of breath. An intracranial hematoma may cause a headache or symptoms such as weakness, trouble speaking, or a change in consciousness. DIAGNOSIS  A hematoma can usually be diagnosed based on your medical history and a physical exam. Imaging tests may be needed if your health care provider suspects a hematoma in deeper tissues or body spaces, such as the abdomen, head, or chest. These tests may include ultrasonography or a CT scan.  TREATMENT  Hematomas usually go away on their own over time. Rarely does the blood need to be drained out of the body. Large hematomas or those that may affect vital organs will sometimes need surgical drainage or monitoring. HOME CARE INSTRUCTIONS   Apply ice to the injured area:   Put ice in a plastic bag.   Place a towel between your skin and the bag.   Leave the ice on for 20 minutes, 2-3 times a day for the first 1 to 2 days.   After the first 2 days, switch to using warm compresses on the hematoma.   Elevate the injured area to help decrease pain and swelling. Wrapping the area with an elastic bandage may also be helpful. Compression helps to reduce swelling and promotes shrinking of the hematoma. Make sure the bandage is not wrapped too tight.   If your hematoma is on a lower extremity and is painful, crutches may be helpful for a couple days.   Only take over-the-counter or prescription medicines as directed by your health care provider. SEEK IMMEDIATE MEDICAL CARE IF:   You have increasing pain, or your pain is not controlled with medicine.   You have a fever.   You have worsening swelling or discoloration.   Your skin over the hematoma breaks or starts bleeding.   Your hematoma is in your chest or abdomen and you have weakness, shortness of breath, or a change in consciousness.  Your hematoma is on your scalp (caused by a fall or injury) and you have a worsening headache or a change in alertness or consciousness. MAKE SURE YOU:    Understand these instructions.  Will watch your condition.  Will get help right away if you are not doing well or get worse. Document Released: 10/29/2003 Document Revised: 11/16/2012 Document Reviewed: 08/24/2012 [facility] Patient Information 2015 [place], [place]. This information is not intended to replace advice given to you by your health care provider. Make sure you discuss any questions you have with your health care provider.  Stitches, Staples, or Skin Adhesive Strips  Stitches (sutures), staples, and skin adhesive strips hold the skin together as it heals. They will usually be in place for 7 days or less. HOME CARE  Wash your hands with soap and water before and after you touch your wound.  Only take medicine as told by your doctor.  Cover your wound only if your doctor told you to. Otherwise, leave it open to air.  Do not get your stitches wet or dirty. If they get dirty, dab them gently with a clean washcloth. Wet the [employer]  with soapy water. Do not rub. Pat them dry gently.  Do not put medicine or medicated cream on your stitches unless your doctor told you to.  Do not take out your own stitches or staples. Skin adhesive strips will fall off by themselves.  Do not pick at the wound. Picking can cause an infection.  Do not miss your follow-up appointment.  If you have problems or questions, call your doctor. GET HELP RIGHT AWAY IF:   You have a temperature by mouth above 102 F (38.9 C), not controlled by medicine.  You have chills.  You have redness or pain around your stitches.  There is puffiness (swelling) around your stitches.  You notice fluid (drainage) from your stitches.  There is a bad smell coming from your wound. MAKE SURE YOU:  Understand these instructions.  Will watch your condition.  Will get help if you are not doing well or get worse. Document Released: 01/11/2009 Document Revised: 06/08/2011 Document Reviewed: 01/11/2009 [facility]  Patient Information 2015 [place], [place]. This information is not intended to replace advice given to you by your health care provider. Make sure you discuss any questions you have with your health care provider.

## 2014-11-23 NOTE — ED Notes (Signed)
Per EMS, Pt, from [facility] on [address], c/o head injury/laceration after a stumble and fall this morning.  Staff reports that Pt hit her head on a baseboard and is at neuro baseline.  A & Ox2.  Pt takes Plavix.

## 2014-11-24 LAB — URINE CULTURE: CULTURE: NO GROWTH

## 2014-12-18 ENCOUNTER — Emergency Department (HOSPITAL_COMMUNITY)
Admission: EM | Admit: 2014-12-18 | Discharge: 2014-12-18 | Disposition: A | Discharge status: Home / Self Care | Payer: Medicare Other | Attending: Emergency Medicine | Admitting: Emergency Medicine

## 2014-12-18 ENCOUNTER — Emergency Department (HOSPITAL_COMMUNITY): Payer: Medicare Other

## 2014-12-18 ENCOUNTER — Encounter (HOSPITAL_COMMUNITY): Payer: Self-pay | Admitting: *Deleted

## 2014-12-18 DIAGNOSIS — I2510 Atherosclerotic heart disease of native coronary artery without angina pectoris: Secondary | ICD-10-CM | POA: Insufficient documentation

## 2014-12-18 DIAGNOSIS — W01190A Fall on same level from slipping, tripping and stumbling with subsequent striking against furniture, initial encounter: Secondary | ICD-10-CM | POA: Insufficient documentation

## 2014-12-18 DIAGNOSIS — Y9389 Activity, other specified: Secondary | ICD-10-CM | POA: Insufficient documentation

## 2014-12-18 DIAGNOSIS — K219 Gastro-esophageal reflux disease without esophagitis: Secondary | ICD-10-CM | POA: Diagnosis not present

## 2014-12-18 DIAGNOSIS — S0101XA Laceration without foreign body of scalp, initial encounter: Secondary | ICD-10-CM | POA: Insufficient documentation

## 2014-12-18 DIAGNOSIS — S199XXA Unspecified injury of neck, initial encounter: Secondary | ICD-10-CM | POA: Diagnosis not present

## 2014-12-18 DIAGNOSIS — Z8669 Personal history of other diseases of the nervous system and sense organs: Secondary | ICD-10-CM | POA: Diagnosis not present

## 2014-12-18 DIAGNOSIS — Z79899 Other long term (current) drug therapy: Secondary | ICD-10-CM | POA: Insufficient documentation

## 2014-12-18 DIAGNOSIS — Z8673 Personal history of transient ischemic attack (TIA), and cerebral infarction without residual deficits: Secondary | ICD-10-CM | POA: Insufficient documentation

## 2014-12-18 DIAGNOSIS — Y92129 Unspecified place in nursing home as the place of occurrence of the external cause: Secondary | ICD-10-CM | POA: Diagnosis not present

## 2014-12-18 DIAGNOSIS — Y999 Unspecified external cause status: Secondary | ICD-10-CM | POA: Insufficient documentation

## 2014-12-18 DIAGNOSIS — I10 Essential (primary) hypertension: Secondary | ICD-10-CM | POA: Diagnosis not present

## 2014-12-18 DIAGNOSIS — S0990XA Unspecified injury of head, initial encounter: Secondary | ICD-10-CM | POA: Diagnosis present

## 2014-12-18 DIAGNOSIS — W19XXXA Unspecified fall, initial encounter: Secondary | ICD-10-CM

## 2014-12-18 MED ORDER — ACETAMINOPHEN 325 MG PO TABS: 650.0000 mg | ORAL_TABLET | Freq: Once | ORAL | Status: DC

## 2014-12-18 NOTE — ED Provider Notes (Addendum)
CSN: [id number]     Arrival date & time 12/18/14  4098 History   First MD Initiated Contact with Patient 12/18/14 [phone]     Chief Complaint  Patient presents with  . Fall     (Consider location/radiation/quality/duration/timing/severity/associated sxs/prior Treatment) Patient is a 81 y.o. female presenting with fall. The history is provided by the patient, the EMS personnel and the nursing home.  Fall This is a recurrent (Patient was at the nursing facility and had a mechanical fall where she tripped and fell onto a chair. However her head got stuck in the arm of the chair and they had a difficult time getting her out of the chair. ) problem. The current episode started 1 to 2 hours ago. The problem occurs constantly. The problem has not changed since onset.Associated symptoms include headaches. Associated symptoms comments: Neck pain.  No LOC, numbness or tingling to the arms or legs. Hematoma to the back of her head. Nursing home staff states that patient is at her normal mental status baseline . Nothing aggravates the symptoms. Nothing relieves the symptoms. She has tried nothing for the symptoms. The treatment provided no relief.    Past Medical History  Diagnosis Date  . Hypertension   . GERD (gastroesophageal reflux disease)   . Palpitations   . Chronic fatigue   . Syncope 2007  . Vertebrobasilar TIAs   . Optic nerve disorder     right  . Nausea   . Arteriosclerotic heart disease   . DNR (do not resuscitate)   . Anemia   . Stroke     TIA  . Cataract    Past Surgical History  Procedure Laterality Date  . Rectal polypectomy    . Cataract extraction, bilateral    . Transthoracic echocardiogram  04/02/2005    EF 50-60%  . Esophageal dilitation    . Upper gastrointestinal endoscopy    . Colonoscopy    . Polypectomy    . Cholecystectomy     Family History  Problem Relation Age of Onset  . Heart disease Father   . Heart attack Father   . Heart disease Brother   . Heart  disease Brother   . Heart disease Brother   . Heart disease Brother   . Heart disease Brother   . Heart attack Brother   . Heart attack Brother   . Heart attack Brother   . Colon cancer Neg Hx   . Rectal cancer Neg Hx   . Stomach cancer Neg Hx    Social History  Substance Use Topics  . Smoking status: Never Smoker   . Smokeless tobacco: Never Used  . Alcohol Use: No   OB History    No data available     Review of Systems  Neurological: Positive for headaches.  All other systems reviewed and are negative.     Allergies  Codeine and Mucinex  Home Medications   Prior to Admission medications   Medication Sig Start Date End Date Taking? Authorizing Provider  acetaminophen (TYLENOL 8 HOUR) 650 MG CR tablet Take 1 tablet (650 mg total) by mouth every 8 (eight) hours as needed for pain. 11/23/14   Varney Biles, MD  atenolol (TENORMIN) 50 MG tablet Take 50 mg by mouth daily.    Historical Provider, MD  clopidogrel (PLAVIX) 75 MG tablet Take 1 tablet (75 mg total) by mouth daily. 03/11/12   Peter M Martinique, MD  furosemide (LASIX) 20 MG tablet Take 20 mg by mouth daily.  Historical Provider, MD  KLOR-CON M10 10 MEQ tablet Take 10 mEq by mouth daily.  04/09/14   Historical Provider, MD  lisinopril (PRINIVIL,ZESTRIL) 10 MG tablet Take 10 mg by mouth daily.     Historical Provider, MD  Melatonin 3 MG TABS Take 3 mg by mouth at bedtime.    Historical Provider, MD  Nutritional Supplements (ENSURE PO) Take 1 Bottle by mouth 3 (three) times daily. Take 1 shake 900,1700,and 2100    Historical Provider, MD  pantoprazole (PROTONIX) 20 MG tablet Take 20 mg by mouth daily.    Historical Provider, MD  ranitidine (ZANTAC) 75 MG tablet Take 75 mg by mouth 2 (two) times daily.    Historical Provider, MD  sertraline (ZOLOFT) 25 MG tablet Take 12.5 mg by mouth daily. 10/17/14   Historical Provider, MD  Zinc 50 MG CAPS Take 50 mg by mouth daily.    Historical Provider, MD   SpO2 99% Physical  Exam  Constitutional: She appears well-developed and well-nourished. No distress.  HENT:  Head: Normocephalic and atraumatic.    Eyes: EOM are normal. Pupils are equal, round, and reactive to light.  Neck: Muscular tenderness present. No spinous process tenderness present.    Cardiovascular: Normal rate, regular rhythm, normal heart sounds and intact distal pulses.  Exam reveals no friction rub.   No murmur heard. Pulmonary/Chest: Effort normal and breath sounds normal. She has no wheezes. She has no rales.  Abdominal: Soft. Bowel sounds are normal. She exhibits no distension. There is no tenderness. There is no rebound and no guarding.  Musculoskeletal: Normal range of motion. She exhibits no tenderness.  No edema.  Full range of motion at the bilateral shoulders, hips, knees. No pain in the extremities  Neurological: She is alert. No cranial nerve deficit.  Oriented to person. Patient is able to move upper and lower extremities without difficulty. No numbness or tingling present.  Skin: Skin is warm and dry. No rash noted.  Psychiatric: She has a normal mood and affect. Her behavior is normal.  Nursing note and vitals reviewed.   ED Course  Procedures (including critical care time) Labs Review Labs Reviewed - No data to display  Imaging Review Ct Head Wo Contrast  12/18/2014   CLINICAL DATA:  Status post fall today with a blow to the head. Hematoma on the back of the head.  EXAM: CT HEAD WITHOUT CONTRAST  CT CERVICAL SPINE WITHOUT CONTRAST  TECHNIQUE: Multidetector CT imaging of the head and cervical spine was performed following the standard protocol without intravenous contrast. Multiplanar CT image reconstructions of the cervical spine were also generated.  COMPARISON:  Head and cervical spine CT scans 11/23/2014.  FINDINGS: CT HEAD FINDINGS  The brain is atrophic with chronic microvascular ischemic change. Scalp hematoma is seen posteriorly. There is no evidence of acute  intracranial abnormality including hemorrhage, infarct, mass lesion, mass effect, midline shift or abnormal extra-axial fluid collection. No hydrocephalus or pneumocephalus. No fracture is identified.  CT CERVICAL SPINE FINDINGS  There is no fracture or malalignment of the cervical spine. Marked loss of disc space height is seen at C5-6 and C6-7 scattered facet degenerative disease is also identified. There is some scarring in the lung apices.  IMPRESSION: Scalp hematoma posteriorly without underlying fracture or acute intracranial abnormality.  No acute abnormality cervical spine.  Atrophy and chronic microvascular ischemic change.  Cervical spondylosis.   Electronically Signed   By: Inge Rise M.D.   On: 12/18/2014 11:55   Ct  Cervical Spine Wo Contrast  12/18/2014   CLINICAL DATA:  Status post fall today with a blow to the head. Hematoma on the back of the head.  EXAM: CT HEAD WITHOUT CONTRAST  CT CERVICAL SPINE WITHOUT CONTRAST  TECHNIQUE: Multidetector CT imaging of the head and cervical spine was performed following the standard protocol without intravenous contrast. Multiplanar CT image reconstructions of the cervical spine were also generated.  COMPARISON:  Head and cervical spine CT scans 11/23/2014.  FINDINGS: CT HEAD FINDINGS  The brain is atrophic with chronic microvascular ischemic change. Scalp hematoma is seen posteriorly. There is no evidence of acute intracranial abnormality including hemorrhage, infarct, mass lesion, mass effect, midline shift or abnormal extra-axial fluid collection. No hydrocephalus or pneumocephalus. No fracture is identified.  CT CERVICAL SPINE FINDINGS  There is no fracture or malalignment of the cervical spine. Marked loss of disc space height is seen at C5-6 and C6-7 scattered facet degenerative disease is also identified. There is some scarring in the lung apices.  IMPRESSION: Scalp hematoma posteriorly without underlying fracture or acute intracranial abnormality.   No acute abnormality cervical spine.  Atrophy and chronic microvascular ischemic change.  Cervical spondylosis.   Electronically Signed   By: Inge Rise M.D.   On: 12/18/2014 11:55   I have personally reviewed and evaluated these images and lab results as part of my medical decision-making.   EKG Interpretation None     LACERATION REPAIR Performed by: Blanchie Dessert Authorized byBlanchie Dessert Consent: Verbal consent obtained. Risks and benefits: risks, benefits and alternatives were discussed Consent given by: patient Patient identity confirmed: provided demographic data Prepped and Draped in normal sterile fashion Wound explored  Laceration Location: occipital scalp  Laceration Length: 1cm  No Foreign Bodies seen or palpated  Anesthesia:none Irrigation method: syringe Amount of cleaning: standard  Skin closure: staples  Number of sutures: 2  Technique: staples  Patient tolerance: Patient tolerated the procedure well with no immediate complications.   MDM   Final diagnoses:  Fall, initial encounter  Scalp laceration, initial encounter    Patient with a mechanical fall at the nursing home where she got her head stuck in the arm of a chair. She was brought in by EMS with C-spine immobilization. Patient complains of headache and neck pain. She is neurovascularly intact at this time. Small wound to the occiput of the scalp. Patient does take Plavix. CT of the head and neck pending  12:21 PM Imaging is neg.  Pt's c-spine cleared.  Wound repaired as above.  Tetanus UTD.  Blanchie Dessert, MD 12/18/14 1221  Blanchie Dessert, MD 12/18/14 1229

## 2014-12-18 NOTE — ED Notes (Signed)
IV removed. Catheter intact. Area clean dry and intact.

## 2014-12-18 NOTE — ED Notes (Signed)
Pt arrives from [place] via [place]. Pt had a fall this morning onto her right side and into a wooden chair. Staff reports she had her head caught in the arm of the chair and they were unable to remove her head from the chair. Pt has a hematoma to back of head, abrasion to right arm and has c/o right hip pain.

## 2014-12-18 NOTE — ED Notes (Signed)
Patient transported to CT 

## 2014-12-18 NOTE — ED Notes (Signed)
Pt is in stable condition upon d/c and is returned to facility via [place].

## 2014-12-30 ENCOUNTER — Inpatient Hospital Stay (HOSPITAL_COMMUNITY)
Admission: EM | Admit: 2014-12-30 | Discharge: 2015-01-02 | DRG: 871 | Disposition: A | Discharge status: Hospice | Payer: Medicare Other | Attending: Internal Medicine | Admitting: Internal Medicine

## 2014-12-30 ENCOUNTER — Emergency Department (HOSPITAL_COMMUNITY): Payer: Medicare Other

## 2014-12-30 ENCOUNTER — Encounter (HOSPITAL_COMMUNITY): Payer: Self-pay

## 2014-12-30 DIAGNOSIS — A419 Sepsis, unspecified organism: Secondary | ICD-10-CM | POA: Diagnosis present

## 2014-12-30 DIAGNOSIS — F0390 Unspecified dementia without behavioral disturbance: Secondary | ICD-10-CM | POA: Diagnosis not present

## 2014-12-30 DIAGNOSIS — I10 Essential (primary) hypertension: Secondary | ICD-10-CM | POA: Diagnosis present

## 2014-12-30 DIAGNOSIS — I2510 Atherosclerotic heart disease of native coronary artery without angina pectoris: Secondary | ICD-10-CM | POA: Diagnosis present

## 2014-12-30 DIAGNOSIS — J690 Pneumonitis due to inhalation of food and vomit: Secondary | ICD-10-CM | POA: Diagnosis present

## 2014-12-30 DIAGNOSIS — D649 Anemia, unspecified: Secondary | ICD-10-CM | POA: Diagnosis present

## 2014-12-30 DIAGNOSIS — R443 Hallucinations, unspecified: Secondary | ICD-10-CM | POA: Diagnosis present

## 2014-12-30 DIAGNOSIS — E43 Unspecified severe protein-calorie malnutrition: Secondary | ICD-10-CM | POA: Diagnosis present

## 2014-12-30 DIAGNOSIS — F0391 Unspecified dementia with behavioral disturbance: Secondary | ICD-10-CM | POA: Diagnosis not present

## 2014-12-30 DIAGNOSIS — Z66 Do not resuscitate: Secondary | ICD-10-CM | POA: Diagnosis present

## 2014-12-30 DIAGNOSIS — H47091 Other disorders of optic nerve, not elsewhere classified, right eye: Secondary | ICD-10-CM | POA: Diagnosis present

## 2014-12-30 DIAGNOSIS — Z681 Body mass index (BMI) 19 or less, adult: Secondary | ICD-10-CM

## 2014-12-30 DIAGNOSIS — G9340 Encephalopathy, unspecified: Secondary | ICD-10-CM | POA: Diagnosis present

## 2014-12-30 DIAGNOSIS — Z515 Encounter for palliative care: Secondary | ICD-10-CM | POA: Diagnosis not present

## 2014-12-30 DIAGNOSIS — Z79899 Other long term (current) drug therapy: Secondary | ICD-10-CM

## 2014-12-30 DIAGNOSIS — Z888 Allergy status to other drugs, medicaments and biological substances status: Secondary | ICD-10-CM

## 2014-12-30 DIAGNOSIS — Z885 Allergy status to narcotic agent status: Secondary | ICD-10-CM

## 2014-12-30 DIAGNOSIS — Z8673 Personal history of transient ischemic attack (TIA), and cerebral infarction without residual deficits: Secondary | ICD-10-CM

## 2014-12-30 DIAGNOSIS — H269 Unspecified cataract: Secondary | ICD-10-CM | POA: Diagnosis present

## 2014-12-30 DIAGNOSIS — Z9049 Acquired absence of other specified parts of digestive tract: Secondary | ICD-10-CM | POA: Diagnosis not present

## 2014-12-30 DIAGNOSIS — J189 Pneumonia, unspecified organism: Secondary | ICD-10-CM

## 2014-12-30 DIAGNOSIS — K219 Gastro-esophageal reflux disease without esophagitis: Secondary | ICD-10-CM | POA: Diagnosis present

## 2014-12-30 DIAGNOSIS — Z7902 Long term (current) use of antithrombotics/antiplatelets: Secondary | ICD-10-CM | POA: Diagnosis not present

## 2014-12-30 DIAGNOSIS — F03918 Unspecified dementia, unspecified severity, with other behavioral disturbance: Secondary | ICD-10-CM | POA: Insufficient documentation

## 2014-12-30 DIAGNOSIS — F05 Delirium due to known physiological condition: Secondary | ICD-10-CM | POA: Diagnosis present

## 2014-12-30 LAB — BASIC METABOLIC PANEL
ANION GAP: 6 (ref 5–15)
BUN: 21 mg/dL — ABNORMAL HIGH (ref 6–20)
CALCIUM: 8.7 mg/dL — AB (ref 8.9–10.3)
CO2: 28 mmol/L (ref 22–32)
Chloride: 105 mmol/L (ref 101–111)
Creatinine, Ser: 0.69 mg/dL (ref 0.44–1.00)
Glucose, Bld: 93 mg/dL (ref 65–99)
Potassium: 4.1 mmol/L (ref 3.5–5.1)
SODIUM: 139 mmol/L (ref 135–145)

## 2014-12-30 LAB — CBC
HEMATOCRIT: 33.9 % — AB (ref 36.0–46.0)
HEMOGLOBIN: 11.2 g/dL — AB (ref 12.0–15.0)
MCH: 29 pg (ref 26.0–34.0)
MCHC: 33 g/dL (ref 30.0–36.0)
MCV: 87.8 fL (ref 78.0–100.0)
Platelets: 390 10*3/uL (ref 150–400)
RBC: 3.86 MIL/uL — ABNORMAL LOW (ref 3.87–5.11)
RDW: 15 % (ref 11.5–15.5)
WBC: 14.3 10*3/uL — AB (ref 4.0–10.5)

## 2014-12-30 LAB — URINALYSIS, ROUTINE W REFLEX MICROSCOPIC
GLUCOSE, UA: NEGATIVE mg/dL
HGB URINE DIPSTICK: NEGATIVE
KETONES UR: NEGATIVE mg/dL
Nitrite: NEGATIVE
PROTEIN: NEGATIVE mg/dL
Specific Gravity, Urine: 1.022 (ref 1.005–1.030)
Urobilinogen, UA: 1 mg/dL (ref 0.0–1.0)
pH: 6 (ref 5.0–8.0)

## 2014-12-30 LAB — URINE MICROSCOPIC-ADD ON

## 2014-12-30 LAB — I-STAT TROPONIN, ED: TROPONIN I, POC: 0 ng/mL (ref 0.00–0.08)

## 2014-12-30 MED ORDER — LISINOPRIL 10 MG PO TABS: 10.0000 mg | ORAL_TABLET | Freq: Every day | ORAL | Status: DC

## 2014-12-30 MED ORDER — VANCOMYCIN HCL IN DEXTROSE 1-5 GM/200ML-% IV SOLN
1000.0000 mg | Freq: Once | INTRAVENOUS | Status: AC
  Administered 2014-12-30: 1000 mg via INTRAVENOUS
  Filled 2014-12-30: qty 200

## 2014-12-30 MED ORDER — POTASSIUM CHLORIDE CRYS ER 20 MEQ PO TBCR: 10.0000 meq | EXTENDED_RELEASE_TABLET | Freq: Every day | ORAL | Status: DC

## 2014-12-30 MED ORDER — CLOPIDOGREL BISULFATE 75 MG PO TABS: 75.0000 mg | ORAL_TABLET | Freq: Every day | ORAL | Status: DC

## 2014-12-30 MED ORDER — ACETAMINOPHEN 325 MG PO TABS
650.0000 mg | ORAL_TABLET | Freq: Four times a day (QID) | ORAL | Status: DC | PRN
  Filled 2014-12-30: qty 2

## 2014-12-30 MED ORDER — HYDROMORPHONE HCL 1 MG/ML IJ SOLN: 0.5000 mg | INTRAMUSCULAR | Status: DC | PRN

## 2014-12-30 MED ORDER — OXYCODONE HCL 5 MG PO TABS: 5.0000 mg | ORAL_TABLET | ORAL | Status: DC | PRN

## 2014-12-30 MED ORDER — SERTRALINE HCL 25 MG PO TABS
12.5000 mg | ORAL_TABLET | Freq: Every day | ORAL | Status: DC
  Administered 2014-12-30: 12.5 mg via ORAL
  Filled 2014-12-30 (×2): qty 0.5

## 2014-12-30 MED ORDER — ACETAMINOPHEN 650 MG RE SUPP: 650.0000 mg | Freq: Four times a day (QID) | RECTAL | Status: DC | PRN

## 2014-12-30 MED ORDER — SODIUM CHLORIDE 0.9 % IV SOLN
INTRAVENOUS | Status: DC
  Administered 2014-12-30 – 2015-01-01 (×3): via INTRAVENOUS

## 2014-12-30 MED ORDER — ATENOLOL 50 MG PO TABS
50.0000 mg | ORAL_TABLET | Freq: Every day | ORAL | Status: DC
  Administered 2015-01-01: 50 mg via ORAL
  Filled 2014-12-30: qty 1

## 2014-12-30 MED ORDER — PIPERACILLIN-TAZOBACTAM 3.375 G IVPB 30 MIN
3.3750 g | Freq: Once | INTRAVENOUS | Status: AC
  Administered 2014-12-30: 3.375 g via INTRAVENOUS
  Filled 2014-12-30: qty 50

## 2014-12-30 MED ORDER — MELATONIN 3 MG PO TABS: 3.0000 mg | ORAL_TABLET | Freq: Every day | ORAL | Status: DC

## 2014-12-30 MED ORDER — FUROSEMIDE 20 MG PO TABS
20.0000 mg | ORAL_TABLET | Freq: Every day | ORAL | Status: DC
  Administered 2015-01-01: 20 mg via ORAL
  Filled 2014-12-30: qty 1

## 2014-12-30 MED ORDER — ONDANSETRON HCL 4 MG/2ML IJ SOLN: 4.0000 mg | Freq: Four times a day (QID) | INTRAMUSCULAR | Status: DC | PRN

## 2014-12-30 MED ORDER — ALUM & MAG HYDROXIDE-SIMETH 200-200-20 MG/5ML PO SUSP: 30.0000 mL | Freq: Four times a day (QID) | ORAL | Status: DC | PRN

## 2014-12-30 MED ORDER — PANTOPRAZOLE SODIUM 20 MG PO TBEC
20.0000 mg | DELAYED_RELEASE_TABLET | Freq: Every day | ORAL | Status: DC
  Administered 2014-12-30: 20 mg via ORAL
  Filled 2014-12-30 (×3): qty 1

## 2014-12-30 MED ORDER — ENOXAPARIN SODIUM 30 MG/0.3ML ~~LOC~~ SOLN
30.0000 mg | Freq: Every day | SUBCUTANEOUS | Status: DC
  Administered 2014-12-30 – 2015-01-01 (×3): 30 mg via SUBCUTANEOUS
  Filled 2014-12-30 (×3): qty 0.3

## 2014-12-30 MED ORDER — ONDANSETRON HCL 4 MG PO TABS: 4.0000 mg | ORAL_TABLET | Freq: Four times a day (QID) | ORAL | Status: DC | PRN

## 2014-12-30 MED ORDER — ZINC SULFATE 220 (50 ZN) MG PO CAPS
220.0000 mg | ORAL_CAPSULE | Freq: Every day | ORAL | Status: DC
  Administered 2014-12-30: 220 mg via ORAL
  Filled 2014-12-30 (×3): qty 1

## 2014-12-30 MED ORDER — ENSURE ENLIVE PO LIQD
1.0000 | Freq: Three times a day (TID) | ORAL | Status: DC
  Administered 2014-12-31 – 2015-01-02 (×3): 237 mL via ORAL

## 2014-12-30 NOTE — ED Notes (Signed)
Bed: NONE Expected date:  Expected time:  Means of arrival:  Comments:

## 2014-12-30 NOTE — H&P (Signed)
[facility] Admission History and Physical       Cadence Minton WLN:[id number] DOB: 1924/09/01 DOA: 12/30/2014  Referring physician: EDP PCP: Lujean Amel, MD  Specialists:   Chief Complaint: Increased Confusion  HPI: Sheryl Shaw is a 81 y.o. female with a history of HTN. And previous CVA who was sent to the ED due to increased confusion since the afternoon.  She was described as having decreased responsiveness, and at time she was hallucinating, and talking about going "home" as in to heaven.   She was evaluated in the ED anda Sepsis workup was initiated, and she was found to have a   Bibasilar Pneumonia on chest X-Ray.  Shew as placed on IV Vancomycin and Zosyn   Review of Systems: Unable to Obtain from Patient  Past Medical History  Diagnosis Date  . Hypertension   . GERD (gastroesophageal reflux disease)   . Palpitations   . Chronic fatigue   . Syncope 2007  . Vertebrobasilar TIAs   . Optic nerve disorder     right  . Nausea   . Arteriosclerotic heart disease   . DNR (do not resuscitate)   . Anemia   . Stroke [facility])     TIA  . Cataract      Past Surgical History  Procedure Laterality Date  . Rectal polypectomy    . Cataract extraction, bilateral    . Transthoracic echocardiogram  04/02/2005    EF 50-60%  . Esophageal dilitation    . Upper gastrointestinal endoscopy    . Colonoscopy    . Polypectomy    . Cholecystectomy        Prior to Admission medications   Medication Sig Start Date End Date Taking? Authorizing Provider  acetaminophen (TYLENOL 8 HOUR) 650 MG CR tablet Take 1 tablet (650 mg total) by mouth every 8 (eight) hours as needed for pain. 11/23/14   Varney Biles, MD  atenolol (TENORMIN) 50 MG tablet Take 50 mg by mouth daily.    Historical Provider, MD  clopidogrel (PLAVIX) 75 MG tablet Take 1 tablet (75 mg total) by mouth daily. 03/11/12   Peter M Martinique, MD  furosemide (LASIX) 20 MG tablet Take 20 mg by mouth daily.     Historical  Provider, MD  KLOR-CON M10 10 MEQ tablet Take 10 mEq by mouth daily.  04/09/14   Historical Provider, MD  lisinopril (PRINIVIL,ZESTRIL) 10 MG tablet Take 10 mg by mouth daily.     Historical Provider, MD  Melatonin 3 MG TABS Take 3 mg by mouth at bedtime.    Historical Provider, MD  Nutritional Supplements (ENSURE PO) Take 1 Bottle by mouth 3 (three) times daily. Take 1 shake 900,1700,and 2100    Historical Provider, MD  pantoprazole (PROTONIX) 20 MG tablet Take 20 mg by mouth daily.    Historical Provider, MD  ranitidine (ZANTAC) 75 MG tablet Take 75 mg by mouth 2 (two) times daily.    Historical Provider, MD  sertraline (ZOLOFT) 25 MG tablet Take 12.5 mg by mouth daily. 10/17/14   Historical Provider, MD  Zinc 50 MG CAPS Take 50 mg by mouth daily.    Historical Provider, MD     Allergies  Allergen Reactions  . Codeine Nausea And Vomiting  . Mucinex [Guaifenesin Er] Other (See Comments)    Makes pt hyper and can't sleep    Social History:  reports that she has never smoked. She has never used smokeless tobacco. She reports that she does not drink alcohol or  use illicit drugs.    Family History  Problem Relation Age of Onset  . Heart disease Father   . Heart attack Father   . Heart disease Brother   . Heart disease Brother   . Heart disease Brother   . Heart disease Brother   . Heart disease Brother   . Heart attack Brother   . Heart attack Brother   . Heart attack Brother   . Colon cancer Neg Hx   . Rectal cancer Neg Hx   . Stomach cancer Neg Hx        Physical Exam:  GEN:  Pleasant Cachectic Elderly  81 y.o.  female examined and in no acute distress; cooperative with exam Filed Vitals:   12/30/14 1632 12/30/14 1924  BP: 138/61 147/61  Pulse: 70 66  Temp: 98 F (36.7 C) 97.9 F (36.6 C)  TempSrc: Oral Oral  Resp: 18 20  SpO2: 100% 100%   Blood pressure 147/61, pulse 66, temperature 97.9 F (36.6 C), temperature source Oral, resp. rate 20, SpO2 100 %. PSYCH: She  is alert and oriented x1; does not appear anxious does not appear depressed; affect is normal HEENT: Normocephalic and Atraumatic, Mucous membranes pink; PERRLA; EOM intact; Fundi:  Benign;  No scleral icterus, Nares: Patent, Oropharynx: Clear, Fair Dentition,    Neck:  FROM, No Cervical Lymphadenopathy nor Thyromegaly or Carotid Bruit; No JVD; Breasts:: Not examined CHEST WALL: No tenderness CHEST: Normal respiration, clear to auscultation bilaterally HEART: Regular rate and rhythm; no murmurs rubs or gallops BACK: No kyphosis or scoliosis; No CVA tenderness ABDOMEN: Positive Bowel Sounds, Scaphoid, Soft Non-Tender, No Rebound or Guarding; No Masses, No Organomegaly Rectal Exam: Not done EXTREMITIES: No Cyanosis, Clubbing, or Edema; No Ulcerations. Genitalia: not examined PULSES: 2+ and symmetric SKIN: Normal hydration no rash or ulceration CNS:  Alert and Oriented x14, No Focal Deficits Vascular: pulses palpable throughout    Labs on Admission:  Basic Metabolic Panel:  Recent Labs Lab 12/30/14 1750  NA 139  K 4.1  CL 105  CO2 28  GLUCOSE 93  BUN 21*  CREATININE 0.69  CALCIUM 8.7*   Liver Function Tests: No results for input(s): AST, ALT, ALKPHOS, BILITOT, PROT, ALBUMIN in the last 168 hours. No results for input(s): LIPASE, AMYLASE in the last 168 hours. No results for input(s): AMMONIA in the last 168 hours. CBC:  Recent Labs Lab 12/30/14 1750  WBC 14.3*  HGB 11.2*  HCT 33.9*  MCV 87.8  PLT 390   Cardiac Enzymes: No results for input(s): CKTOTAL, CKMB, CKMBINDEX, TROPONINI in the last 168 hours.  BNP (last 3 results) No results for input(s): BNP in the last 8760 hours.  ProBNP (last 3 results) No results for input(s): PROBNP in the last 8760 hours.  CBG: No results for input(s): GLUCAP in the last 168 hours.  Radiological Exams on Admission: Dg Chest 2 View  12/30/2014   CLINICAL DATA:  Hallucinations, stroke, syncope  EXAM: CHEST  2 VIEW  COMPARISON:   04/30/2014, 08/20/2014  FINDINGS: There is hazy bibasilar airspace disease. There is no focal consolidation. There is no pleural effusion or pneumothorax. The heart and mediastinal contours are unremarkable.  The osseous structures are unremarkable.  IMPRESSION: Hazy bibasilar airspace disease which may reflect atelectasis versus pneumonia.   Electronically Signed   By: Kathreen Devoid   On: 12/30/2014 18:30   Ct Head Wo Contrast  12/30/2014   CLINICAL DATA:  Hallucinations.  Symptoms for 6 months.  EXAM:  CT HEAD WITHOUT CONTRAST  TECHNIQUE: Contiguous axial images were obtained from the base of the skull through the vertex without intravenous contrast.  COMPARISON:  CT head 12/18/2014.  FINDINGS: No evidence for acute infarction, hemorrhage, mass lesion, hydrocephalus, or extra-axial fluid. Generalized atrophy. Chronic microvascular ischemic change.  No skull fracture. Sequelae from previous scalp hematoma and laceration near the vertex RIGHT parietal region as seen on image 27 were likely placed 12/18/2014, and could be ready for removal. No new scalp hematoma is evident.  No sinus or mastoid disease. Negative orbits. BILATERAL cataract extraction.  Stable appearance from priors.  IMPRESSION: Atrophy and small vessel disease.  No acute intracranial findings.  Sequelae of previous scalp laceration RIGHT parietal region near the vertex. These staples could be ready for removal.   Electronically Signed   By: Staci Righter M.D.   On: 12/30/2014 18:32     .   Assessment/Plan:   81 y.o. female with  Principal Problem:   1.     CAP (community acquired pneumonia)/Aspiration Pneumonia   IV Vancomycin and Zosyn   IVFs   O2 PRN   Monitor O2 Sats   Swallowing Evaluation   Active Problems:   2.    HTN (hypertension)   Continue      3.    Protein-calorie malnutrition, severe ([place])   Nutrition cosult     4.    Acute encephalopathy- due tyo #1     5.    DVT Prophylaxis   Lovenox   Code Status:      DO NOT RESUSCITATE (DNR)      Family Communication:   Son In [place] at Bedside       Disposition Plan:    Inpatient  Status        Time spent:  [place] [facility] Pager [phone]   If [place] Please Contact the Day Rounding Team MD for [facility]  If 7PM-7AM, Please Contact Night-Floor Coverage  [email or web address] Password [email or web address] 12/30/2014, 8:56 PM     ADDENDUM:   Patient was seen and examined on 12/30/2014

## 2014-12-30 NOTE — ED Notes (Signed)
Bed: WA14 Expected date:  Expected time:  Means of arrival:  Comments: EMS AMS 

## 2014-12-30 NOTE — ED Notes (Signed)
Her family phoned EMS re:  Pt. Having hallucinations.  They further stated she began to have hallucinations ~6 months ago.  She arrives in no distress and is oriented to location and situation and correctly calls her two daughters by name.  Her speech is clear.

## 2014-12-30 NOTE — ED Provider Notes (Signed)
CSN: [id number]     Arrival date & time 12/30/14  1618 History   First MD Initiated Contact with Patient 12/30/14 1645     Chief Complaint  Patient presents with  . Hallucinations     (Consider location/radiation/quality/duration/timing/severity/associated sxs/prior Treatment) HPI Comments: 81 year old female presents with hallucinations. They have been going on for several months and happening daily. She has been seeing people in her room her nursing home and will speak to them regularly. She is paranoid about being there and thinks people are trying to kill her and steal things from her. Family reports no change with initiation of Ativan or respiratory to her hallucinations. She does have history of dementia. [name] currently at a assisted living facility and despite family pain fracture services she's not getting the care of her nursing home. They're working with a new nursing home and are close to getting her moved to a new place. Today after breakfast patient was napping, she had a four-hour episode where she was muttering to herself that she was going to be with her mother who is in have an. She awoke one her other daughter arrived and then did not remember doing this for 4 hours. She had her eyes close this entire time. Here she is acting okay, denies any complaints.  Patient is a 81 y.o. female presenting with altered mental status. The history is provided by the patient.  Altered Mental Status Presenting symptoms: behavior changes   Severity:  Mild Most recent episode:  Today Episode history:  Single Duration:  4 hours Timing:  Constant Progression:  Unchanged Chronicity:  Chronic Context: dementia and nursing home resident   Associated symptoms: no abdominal pain, no headaches, no light-headedness and no seizures     Past Medical History  Diagnosis Date  . Hypertension   . GERD (gastroesophageal reflux disease)   . Palpitations   . Chronic fatigue   . Syncope 2007  .  Vertebrobasilar TIAs   . Optic nerve disorder     right  . Nausea   . Arteriosclerotic heart disease   . DNR (do not resuscitate)   . Anemia   . Stroke [facility])     TIA  . Cataract    Past Surgical History  Procedure Laterality Date  . Rectal polypectomy    . Cataract extraction, bilateral    . Transthoracic echocardiogram  04/02/2005    EF 50-60%  . Esophageal dilitation    . Upper gastrointestinal endoscopy    . Colonoscopy    . Polypectomy    . Cholecystectomy     Family History  Problem Relation Age of Onset  . Heart disease Father   . Heart attack Father   . Heart disease Brother   . Heart disease Brother   . Heart disease Brother   . Heart disease Brother   . Heart disease Brother   . Heart attack Brother   . Heart attack Brother   . Heart attack Brother   . Colon cancer Neg Hx   . Rectal cancer Neg Hx   . Stomach cancer Neg Hx    Social History  Substance Use Topics  . Smoking status: Never Smoker   . Smokeless tobacco: Never Used  . Alcohol Use: No   OB History    No data available     Review of Systems  Gastrointestinal: Negative for abdominal pain.  Neurological: Negative for seizures, light-headedness and headaches.  All other systems reviewed and are negative.  Allergies  Codeine and Mucinex  Home Medications   Prior to Admission medications   Medication Sig Start Date End Date Taking? Authorizing Provider  acetaminophen (TYLENOL 8 HOUR) 650 MG CR tablet Take 1 tablet (650 mg total) by mouth every 8 (eight) hours as needed for pain. 11/23/14   Varney Biles, MD  atenolol (TENORMIN) 50 MG tablet Take 50 mg by mouth daily.    Historical Provider, MD  clopidogrel (PLAVIX) 75 MG tablet Take 1 tablet (75 mg total) by mouth daily. 03/11/12   Peter M Martinique, MD  furosemide (LASIX) 20 MG tablet Take 20 mg by mouth daily.     Historical Provider, MD  KLOR-CON M10 10 MEQ tablet Take 10 mEq by mouth daily.  04/09/14   Historical Provider, MD   lisinopril (PRINIVIL,ZESTRIL) 10 MG tablet Take 10 mg by mouth daily.     Historical Provider, MD  Melatonin 3 MG TABS Take 3 mg by mouth at bedtime.    Historical Provider, MD  Nutritional Supplements (ENSURE PO) Take 1 Bottle by mouth 3 (three) times daily. Take 1 shake 900,1700,and 2100    Historical Provider, MD  pantoprazole (PROTONIX) 20 MG tablet Take 20 mg by mouth daily.    Historical Provider, MD  ranitidine (ZANTAC) 75 MG tablet Take 75 mg by mouth 2 (two) times daily.    Historical Provider, MD  sertraline (ZOLOFT) 25 MG tablet Take 12.5 mg by mouth daily. 10/17/14   Historical Provider, MD  Zinc 50 MG CAPS Take 50 mg by mouth daily.    Historical Provider, MD   BP 138/61 mmHg  Pulse 70  Temp(Src) 98 F (36.7 C) (Oral)  Resp 18  SpO2 100% Physical Exam  Constitutional: She appears well-developed and well-nourished. No distress.  HENT:  Head: Normocephalic and atraumatic.  Mouth/Throat: Oropharynx is clear and moist.  Eyes: EOM are normal. Pupils are equal, round, and reactive to light.  Neck: Normal range of motion. Neck supple.  Cardiovascular: Normal rate and regular rhythm.  Exam reveals no friction rub.   No murmur heard. Pulmonary/Chest: Effort normal and breath sounds normal. No respiratory distress. She has no wheezes. She has no rales.  Abdominal: Soft. She exhibits no distension. There is no tenderness. There is no rebound.  Musculoskeletal: Normal range of motion. She exhibits no edema.  Neurological: She is alert. No cranial nerve deficit. She exhibits normal muscle tone. Coordination normal.  Skin: She is not diaphoretic.  Nursing note and vitals reviewed.   ED Course  Procedures (including critical care time) Labs Review Labs Reviewed  CBC - Abnormal; Notable for the following:    WBC 14.3 (*)    RBC 3.86 (*)    Hemoglobin 11.2 (*)    HCT 33.9 (*)    All other components within normal limits  BASIC METABOLIC PANEL - Abnormal; Notable for the  following:    BUN 21 (*)    Calcium 8.7 (*)    All other components within normal limits  URINALYSIS, ROUTINE W REFLEX MICROSCOPIC (NOT AT [facility]) - Abnormal; Notable for the following:    Color, Urine AMBER (*)    APPearance CLOUDY (*)    Bilirubin Urine SMALL (*)    Leukocytes, UA MODERATE (*)    All other components within normal limits  URINE MICROSCOPIC-ADD ON - Abnormal; Notable for the following:    Squamous Epithelial / LPF MANY (*)    Bacteria, UA FEW (*)    All other components within normal limits  CULTURE, BLOOD (ROUTINE X 2)  CULTURE, BLOOD (ROUTINE X 2)  BASIC METABOLIC PANEL  CBC  I-STAT TROPOININ, ED    Imaging Review Dg Chest 2 View  12/30/2014   CLINICAL DATA:  Hallucinations, stroke, syncope  EXAM: CHEST  2 VIEW  COMPARISON:  04/30/2014, 08/20/2014  FINDINGS: There is hazy bibasilar airspace disease. There is no focal consolidation. There is no pleural effusion or pneumothorax. The heart and mediastinal contours are unremarkable.  The osseous structures are unremarkable.  IMPRESSION: Hazy bibasilar airspace disease which may reflect atelectasis versus pneumonia.   Electronically Signed   By: Kathreen Devoid   On: 12/30/2014 18:30   Ct Head Wo Contrast  12/30/2014   CLINICAL DATA:  Hallucinations.  Symptoms for 6 months.  EXAM: CT HEAD WITHOUT CONTRAST  TECHNIQUE: Contiguous axial images were obtained from the base of the skull through the vertex without intravenous contrast.  COMPARISON:  CT head 12/18/2014.  FINDINGS: No evidence for acute infarction, hemorrhage, mass lesion, hydrocephalus, or extra-axial fluid. Generalized atrophy. Chronic microvascular ischemic change.  No skull fracture. Sequelae from previous scalp hematoma and laceration near the vertex RIGHT parietal region as seen on image 27 were likely placed 12/18/2014, and could be ready for removal. No new scalp hematoma is evident.  No sinus or mastoid disease. Negative orbits. BILATERAL cataract extraction.   Stable appearance from priors.  IMPRESSION: Atrophy and small vessel disease.  No acute intracranial findings.  Sequelae of previous scalp laceration RIGHT parietal region near the vertex. These staples could be ready for removal.   Electronically Signed   By: Staci Righter M.D.   On: 12/30/2014 18:32   I have personally reviewed and evaluated these images and lab results as part of my medical decision-making.   EKG Interpretation   Date/Time:  Sunday December 30 2014 18:47:04 EDT Ventricular Rate:  72 PR Interval:  157 QRS Duration: 88 QT Interval:  412 QTC Calculation: 451 R Axis:   44 Text Interpretation:  Sinus rhythm No significant change since last  tracing Confirmed by Mingo Amber  MD, Louisa ([id number]) on 12/31/2014 12:21:54 AM      MDM   Final diagnoses:  Healthcare-associated pneumonia  Hallucinations    81 year old female here with her 2 daughters for hallucinations.She has chronic loose in a patient, likely from dementia. Medications have been given by her doctors to try and help, they are ineffective. Today she had four-hour episode of hallucinations that had a death seen. This is the first time she has done this. She does not remember doing this and awoke after her second daughter arrived. The patient had her eyes closed during this entire episode. She did not report any chest pain or shortness of breath at that time and daughter stated she appeared comfortable. Here she is at her baseline, relaxing comfortably. We'll check labs to see [place] she is doing. Daughter does warrant a new nursing home and are working on trying to get her placed in one now. They want to see if she can stay to get her a new nursing home because patient is afraid to go back to her current nursing home. I explained to them that since there working on finding placement, we cannot keep her in the hospital.  CXR shows pneumonia. Blood cultures obtained, given antibiotics. Admitted.    Evelina Bucy, MD 12/31/14  [phone]

## 2014-12-30 NOTE — Progress Notes (Signed)
Pt admitted from ED to 1329. No family present with patient, ED NT reports they went home and will return tomorrow. Most of admission questions deferred until family present due to pts impaired cognition. Pt alert to self and place, extremely HOH. Bil hearing aids at bedside. Continue to monitor. Hortencia Conradi RN

## 2014-12-31 ENCOUNTER — Encounter (HOSPITAL_COMMUNITY): Payer: Self-pay

## 2014-12-31 DIAGNOSIS — F0390 Unspecified dementia without behavioral disturbance: Secondary | ICD-10-CM | POA: Insufficient documentation

## 2014-12-31 DIAGNOSIS — F03918 Unspecified dementia, unspecified severity, with other behavioral disturbance: Secondary | ICD-10-CM | POA: Insufficient documentation

## 2014-12-31 DIAGNOSIS — R443 Hallucinations, unspecified: Secondary | ICD-10-CM | POA: Insufficient documentation

## 2014-12-31 DIAGNOSIS — F0391 Unspecified dementia with behavioral disturbance: Secondary | ICD-10-CM | POA: Insufficient documentation

## 2014-12-31 LAB — CBC
HCT: 32 % — ABNORMAL LOW (ref 36.0–46.0)
Hemoglobin: 10.3 g/dL — ABNORMAL LOW (ref 12.0–15.0)
MCH: 28.3 pg (ref 26.0–34.0)
MCHC: 32.2 g/dL (ref 30.0–36.0)
MCV: 87.9 fL (ref 78.0–100.0)
PLATELETS: 362 10*3/uL (ref 150–400)
RBC: 3.64 MIL/uL — AB (ref 3.87–5.11)
RDW: 15.1 % (ref 11.5–15.5)
WBC: 9.3 10*3/uL (ref 4.0–10.5)

## 2014-12-31 LAB — BASIC METABOLIC PANEL
Anion gap: 7 (ref 5–15)
BUN: 20 mg/dL (ref 6–20)
CALCIUM: 8.4 mg/dL — AB (ref 8.9–10.3)
CO2: 27 mmol/L (ref 22–32)
CREATININE: 0.66 mg/dL (ref 0.44–1.00)
Chloride: 105 mmol/L (ref 101–111)
GLUCOSE: 94 mg/dL (ref 65–99)
Potassium: 3.7 mmol/L (ref 3.5–5.1)
Sodium: 139 mmol/L (ref 135–145)

## 2014-12-31 MED ORDER — SODIUM CHLORIDE 0.9 % IV SOLN
1.5000 g | Freq: Four times a day (QID) | INTRAVENOUS | Status: DC
  Administered 2014-12-31 – 2015-01-01 (×5): 1.5 g via INTRAVENOUS
  Filled 2014-12-31 (×7): qty 1.5

## 2014-12-31 MED ORDER — SERTRALINE HCL 50 MG PO TABS
25.0000 mg | ORAL_TABLET | Freq: Every day | ORAL | Status: DC
  Administered 2014-12-31 – 2015-01-01 (×2): 25 mg via ORAL
  Filled 2014-12-31 (×2): qty 1

## 2014-12-31 MED ORDER — RESOURCE THICKENUP CLEAR PO POWD
ORAL | Status: DC | PRN
  Filled 2014-12-31: qty 125

## 2014-12-31 MED ORDER — LORAZEPAM 2 MG/ML IJ SOLN
0.5000 mg | Freq: Once | INTRAMUSCULAR | Status: AC
  Administered 2014-12-31: 0.5 mg via INTRAVENOUS
  Filled 2014-12-31: qty 1

## 2014-12-31 MED ORDER — FAMOTIDINE 20 MG PO TABS: 20.0000 mg | ORAL_TABLET | Freq: Every day | ORAL | Status: DC

## 2014-12-31 NOTE — Progress Notes (Signed)
[facility] PROGRESS NOTE    Progress Note   Sheryl Shaw WUJ:[id number] DOB: 1924-09-09 DOA: 12/30/2014 PCP: Lujean Amel, MD   Brief Narrative:   Sheryl Shaw is an 81 y.o. female   Assessment/Plan:   Aspiration pneumonia due to gastric secretions ([place]): - History of esophageal dilation several years ago. I am really concerned about aspiration pneumonia, dizziness second visit to the hospital for an primary a lung infection. Swallowing evaluation was done on previous admission, that showed no signs of aspiration. [name] not on any sedatives at home, will repeat swallowing evaluation.  Essential  HTN (hypertension): Cont current home regimen, BP stable.  Protein-calorie malnutrition, severe ([place])  Acute encephalopathy superimpose on dementia with behavioral disturbance Use Tylenol when necessary., Overnight she was fidgety but not agitated to the point where she was trying to get out of bed.    DVT Prophylaxis - Lovenox ordered.  Family Communication: daughter Disposition Plan: Home when stable. Code Status:     Code Status Orders        Start     Ordered   12/30/14 2123  Do not attempt resuscitation (DNR)   Continuous    Question Answer Comment  In the event of cardiac or respiratory ARREST Do not call a "code blue"   In the event of cardiac or respiratory ARREST Do not perform Intubation, CPR, defibrillation or ACLS   In the event of cardiac or respiratory ARREST Use medication by any route, position, wound care, and other measures to relive pain and suffering. May use oxygen, suction and manual treatment of airway obstruction as needed for comfort.      12/30/14 2122    Advance Directive Documentation        Most Recent Value   Type of Advance Directive  Out of facility DNR (pink MOST or yellow form)   Pre-existing out of facility DNR order (yellow form or pink MOST form)  Physician notified to receive inpatient order   "MOST" Form in Place?            IV Access:    Peripheral IV   Procedures and diagnostic studies:   Dg Chest 2 View  12/30/2014   CLINICAL DATA:  Hallucinations, stroke, syncope  EXAM: CHEST  2 VIEW  COMPARISON:  04/30/2014, 08/20/2014  FINDINGS: There is hazy bibasilar airspace disease. There is no focal consolidation. There is no pleural effusion or pneumothorax. The heart and mediastinal contours are unremarkable.  The osseous structures are unremarkable.  IMPRESSION: Hazy bibasilar airspace disease which may reflect atelectasis versus pneumonia.   Electronically Signed   By: Kathreen Devoid   On: 12/30/2014 18:30   Ct Head Wo Contrast  12/30/2014   CLINICAL DATA:  Hallucinations.  Symptoms for 6 months.  EXAM: CT HEAD WITHOUT CONTRAST  TECHNIQUE: Contiguous axial images were obtained from the base of the skull through the vertex without intravenous contrast.  COMPARISON:  CT head 12/18/2014.  FINDINGS: No evidence for acute infarction, hemorrhage, mass lesion, hydrocephalus, or extra-axial fluid. Generalized atrophy. Chronic microvascular ischemic change.  No skull fracture. Sequelae from previous scalp hematoma and laceration near the vertex RIGHT parietal region as seen on image 27 were likely placed 12/18/2014, and could be ready for removal. No new scalp hematoma is evident.  No sinus or mastoid disease. Negative orbits. BILATERAL cataract extraction.  Stable appearance from priors.  IMPRESSION: Atrophy and small vessel disease.  No acute intracranial findings.  Sequelae of previous scalp laceration RIGHT parietal region near the  vertex. These staples could be ready for removal.   Electronically Signed   By: Staci Righter M.D.   On: 12/30/2014 18:32     Medical Consultants:    None.  Anti-Infectives:   Anti-infectives    Start     Dose/Rate Route Frequency Ordered Stop   12/31/14 0800  ampicillin-sulbactam (UNASYN) 1.5 g in sodium chloride 0.9 % 50 mL IVPB     1.5 g 100 mL/hr over 30 Minutes Intravenous  Every 6 hours 12/31/14 0755     12/30/14 1930  vancomycin (VANCOCIN) IVPB 1000 mg/200 mL premix     1,000 mg 200 mL/hr over 60 Minutes Intravenous  Once 12/30/14 1915 12/30/14 2144   12/30/14 1930  piperacillin-tazobactam (ZOSYN) IVPB 3.375 g     3.375 g 100 mL/hr over 30 Minutes Intravenous  Once 12/30/14 1915 12/30/14 2231      Subjective:    Sheryl Shaw nonverbal  Objective:    Filed Vitals:   12/30/14 1632 12/30/14 1924 12/30/14 2126 12/31/14 0437  BP: 138/61 147/61 125/56 125/63  Pulse: 70 66 62 71  Temp: 98 F (36.7 C) 97.9 F (36.6 C) 97.5 F (36.4 C) 97 F (36.1 C)  TempSrc: Oral Oral Oral Oral  Resp: 18 20 20 16   Weight:   43.727 kg (96 lb 6.4 oz)   SpO2: 100% 100% 100% 98%   No intake or output data in the 24 hours ending 12/31/14 0755 Filed Weights   12/30/14 2126  Weight: 43.727 kg (96 lb 6.4 oz)    Exam: Gen:  NAD, nonverbal Cardiovascular:  RRR, No M/R/G Respiratory:  Lungs CTAB Gastrointestinal:  Abdomen soft, NT/ND, + BS Extremities:  No C/E/C   Data Reviewed:    Labs: Basic Metabolic Panel:  Recent Labs Lab 12/30/14 1750 12/31/14 0422  NA 139 139  K 4.1 3.7  CL 105 105  CO2 28 27  GLUCOSE 93 94  BUN 21* 20  CREATININE 0.69 0.66  CALCIUM 8.7* 8.4*   GFR CrCl cannot be calculated (Unknown ideal weight.). Liver Function Tests: No results for input(s): AST, ALT, ALKPHOS, BILITOT, PROT, ALBUMIN in the last 168 hours. No results for input(s): LIPASE, AMYLASE in the last 168 hours. No results for input(s): AMMONIA in the last 168 hours. Coagulation profile No results for input(s): INR, PROTIME in the last 168 hours.  CBC:  Recent Labs Lab 12/30/14 1750 12/31/14 0422  WBC 14.3* 9.3  HGB 11.2* 10.3*  HCT 33.9* 32.0*  MCV 87.8 87.9  PLT 390 362   Cardiac Enzymes: No results for input(s): CKTOTAL, CKMB, CKMBINDEX, TROPONINI in the last 168 hours. BNP (last 3 results) No results for input(s): PROBNP in the last 8760  hours. CBG: No results for input(s): GLUCAP in the last 168 hours. D-Dimer: No results for input(s): DDIMER in the last 72 hours. Hgb A1c: No results for input(s): HGBA1C in the last 72 hours. Lipid Profile: No results for input(s): CHOL, HDL, LDLCALC, TRIG, CHOLHDL, LDLDIRECT in the last 72 hours. Thyroid function studies: No results for input(s): TSH, T4TOTAL, T3FREE, THYROIDAB in the last 72 hours.  Invalid input(s): FREET3 Anemia work up: No results for input(s): VITAMINB12, FOLATE, FERRITIN, TIBC, IRON, RETICCTPCT in the last 72 hours. Sepsis Labs:  Recent Labs Lab 12/30/14 1750 12/31/14 0422  WBC 14.3* 9.3   Microbiology No results found for this or any previous visit (from the past 240 hour(s)).   Medications:   . ampicillin-sulbactam (UNASYN) IV  1.5 g Intravenous Q6H  .  atenolol  50 mg Oral Daily  . clopidogrel  75 mg Oral Daily  . enoxaparin (LOVENOX) injection  30 mg Subcutaneous QHS  . feeding supplement (ENSURE ENLIVE)  1 Bottle Oral TID BM  . furosemide  20 mg Oral Daily  . lisinopril  10 mg Oral Daily  . pantoprazole  20 mg Oral Daily  . potassium chloride  10 mEq Oral Daily  . sertraline  12.5 mg Oral Daily  . zinc sulfate  220 mg Oral Daily   Continuous Infusions: . sodium chloride 75 mL/hr at 12/30/14 2201    Time spent: 25 min   LOS: 1 day   Charlynne Cousins  [facility] Pager [phone]  *Please refer to [email or web address], password [email or web address] to get updated schedule on who will round on this patient, as hospitalists switch teams weekly. If 7PM-7AM, please contact night-coverage at [email or web address], password [email or web address] for any overnight needs.  12/31/2014, 7:55 AM

## 2014-12-31 NOTE — Evaluation (Signed)
Clinical/Bedside Swallow Evaluation Patient Details  Name: Sheryl Shaw MRN: [id number] Date of Birth: Mar 14, 1925  Today's Date: 12/31/2014 Time: SLP Start Time (ACUTE ONLY): 1001 SLP Stop Time (ACUTE ONLY): 1023 SLP Time Calculation (min) (ACUTE ONLY): 22 min  Past Medical History:  Past Medical History  Diagnosis Date  . Hypertension   . GERD (gastroesophageal reflux disease)   . Palpitations   . Chronic fatigue   . Syncope 2007  . Vertebrobasilar TIAs   . Optic nerve disorder     right  . Nausea   . Arteriosclerotic heart disease   . DNR (do not resuscitate)   . Anemia   . Stroke [facility])     TIA  . Cataract    Past Surgical History:  Past Surgical History  Procedure Laterality Date  . Rectal polypectomy    . Cataract extraction, bilateral    . Transthoracic echocardiogram  04/02/2005    EF 50-60%  . Esophageal dilitation    . Upper gastrointestinal endoscopy    . Colonoscopy    . Polypectomy    . Cholecystectomy     HPI:  81 y.o. female with a history of HTN, CVA, GERD, TIA's, optic nerve disorder, chronic fatigue admitted due to increased confusion, hallucinating and per MD notes talking about going "home" as in to [place].CXR hazy bibasilar airspace disease which may reflect atelectasis versus pneumonia. EGD 06/25/14 revealed mild distal esophageal stricture. Status post dilatation with Savary dilators from 12-16 mm, small sliding hiatal hernia. BSE 05/03/14 with occassional throat clear but functional swallow without concerns for aspiration, Dys 3 and thin recommended   Assessment / Plan / Recommendation Clinical Impression  Pt refused food/liquid following initial sip water via cup. Max verbal encouragement required for additional trials. High probability of aspiration given consistent and significant wet vocal quality after cup sip thin. Agreeable to one sip honey thickened juice which did not result in cough/throat clear or wet vocal quality. She refused puree/  applesauce. Difficult situation with high suspicion of aspiration, however given behavior during MBS, pt would not comply during an MBS. No family present. Recommending Dys 1 diet and honey thick liquids, crush pills, Palliative care consult may be beneficial.      Aspiration Risk   (mod-severe)    Diet Recommendation Dysphagia 1 (Puree);Honey   Medication Administration: Crushed with puree Compensations: Slow rate;Small sips/bites    Other  Recommendations Oral Care Recommendations: Oral care BID Other Recommendations: Order thickener from pharmacy   Follow Up Recommendations       Frequency and Duration min 1 x/week  1 week   Pertinent Vitals/Pain none    SLP Swallow Goals     Swallow Study Prior Functional Status       General Other Pertinent Information: 81 y.o. female with a history of HTN, CVA, GERD, TIA's, optic nerve disorder, chronic fatigue admitted due to increased confusion, hallucinating and per MD notes talking about going "home" as in to [place].CXR hazy bibasilar airspace disease which may reflect atelectasis versus pneumonia. EGD 06/25/14 revealed mild distal esophageal stricture. Status post dilatation with Savary dilators from 12-16 mm, small sliding hiatal hernia. BSE 05/03/14 with occassional throat clear but functional swallow without concerns for aspiration, Dys 3 and thin recommended Type of Study: Bedside swallow evaluation Previous Swallow Assessment:  (see HPI) Diet Prior to this Study: Regular;Thin liquids Temperature Spikes Noted: No Respiratory Status: Room air History of Recent Intubation: No Behavior/Cognition: Alert;Requires cueing;Distractible (refusing, needs lots encouragement) Oral Cavity - Dentition: Adequate  natural dentition/normal for age Self-Feeding Abilities: Able to feed self Patient Positioning: Upright in bed Baseline Vocal Quality: Normal Volitional Cough:  (not attempted due to behavior) Volitional Swallow:  (not attempted due  to behavior)    Oral/Motor/Sensory Function Overall Oral Motor/Sensory Function:  (not attempted due to behavior)   Ice Chips Ice chips: Not tested   Thin Liquid Thin Liquid: Impaired Presentation: Cup Pharyngeal  Phase Impairments: Suspected delayed Swallow;Wet Vocal Quality;Cough - Delayed    Nectar Thick Nectar Thick Liquid: Not tested   Honey Thick Honey Thick Liquid: Impaired Presentation: Cup Pharyngeal Phase Impairments: Suspected delayed Swallow   Puree Puree:  (pt refused)   Solid   GO    Solid: Not tested       Houston Siren 12/31/2014,10:46 AM   Orbie Pyo Colvin Caroli.Ed [employer] [phone]

## 2014-12-31 NOTE — Progress Notes (Addendum)
Initial Nutrition Assessment  DOCUMENTATION CODES:   Severe malnutrition in context of chronic illness, Underweight  INTERVENTION:   -Continue Ensure Enlive po BID, each supplement provides 350 kcal and 20 grams of protein -Encourage PO intake -RD to continue to monitor for plan  NUTRITION DIAGNOSIS:   Malnutrition related to chronic illness, lethargy/confusion as evidenced by percent weight loss, energy intake < or equal to 75% for > or equal to 1 month, severe depletion of body fat, severe depletion of muscle mass.  GOAL:   Patient will meet greater than or equal to 90% of their needs  MONITOR:   PO intake, Supplement acceptance, Labs, Weight trends, Skin, I & O's  REASON FOR ASSESSMENT:   Consult Assessment of nutrition requirement/status  ASSESSMENT:   81 y.o. female with a history of HTN. And previous CVA who was sent to the ED due to increased confusion since the afternoon. She was described as having decreased responsiveness, and at time she was hallucinating, and talking about going "home" as in to heaven.  Unable to gather any information from patient at this time. Per RN, pt very lethargic and no family present in room. Pt has not been eating and SLP attempted evaluation today. SLP recommends dysphagia 1 and honey thick liquids.  Per weight history, pt has lost 24 lb since 2/02 (20% weight loss x 8 months, significant for time frame).  Pt has been ordered Ensure supplements, is not drinking at this time.  Nutrition-Focused physical exam completed. Findings are severe fat depletion, severe muscle depletion, and no edema.   Labs reviewed.  Diet Order:  DIET - DYS 1 Room service appropriate?: Yes; Fluid consistency:: Honey Thick  Skin:  Reviewed, no issues  Last BM:  PTA  Height:   Ht Readings from Last 1 Encounters:  12/31/14 5\' 7"  (1.702 m)    Weight:   Wt Readings from Last 1 Encounters:  12/30/14 96 lb 6.4 oz (43.727 kg)    Ideal Body Weight:   61.4 kg  BMI:  Body mass index is 15.09 kg/(m^2).  Estimated Nutritional Needs:   Kcal:  1300-1500  Protein:  60-70g  Fluid:  1.5L/day  EDUCATION NEEDS:   No education needs identified at this time  Clayton Bibles, MS, RD, LDN Pager: [phone] After Hours Pager: [phone]

## 2014-12-31 NOTE — Progress Notes (Signed)
Pt calm, cooperative, A&O to self and place at beginning of shift. Around 0300, woke up confused and hallucinating, unable to be reoriented. Repeatedly asked for something to eat but wouldn't take any food that staff offered her, as she thought we had poisoned it. Pt started trying to get out of bed and tried to hit staff that were assisting her. Paged on call NP and received one time order for ativan 0.5mg  IV. Good response from medication, pt resting quietly now. Continue to monitor. Hortencia Conradi RN

## 2015-01-01 MED ORDER — MORPHINE SULFATE (CONCENTRATE) 10 MG/0.5ML PO SOLN: 10.0000 mg | ORAL | Status: DC | PRN

## 2015-01-01 MED ORDER — AMOXICILLIN-POT CLAVULANATE 400-57 MG/5ML PO SUSR
875.0000 mg | Freq: Two times a day (BID) | ORAL | Status: DC
  Administered 2015-01-01 – 2015-01-02 (×3): 875 mg via ORAL
  Filled 2015-01-01 (×6): qty 10.9

## 2015-01-01 MED ORDER — TUBERCULIN PPD 5 UNIT/0.1ML ID SOLN
5.0000 [IU] | Freq: Once | INTRADERMAL | Status: DC
  Administered 2015-01-01: 5 [IU] via INTRADERMAL
  Filled 2015-01-01 (×2): qty 0.1

## 2015-01-01 MED ORDER — AMOXICILLIN-POT CLAVULANATE 600-42.9 MG/5ML PO SUSR: 875.0000 mg | Freq: Two times a day (BID) | ORAL | Status: DC

## 2015-01-01 MED ORDER — LORAZEPAM 1 MG PO TABS: 1.0000 mg | ORAL_TABLET | ORAL | Status: DC | PRN

## 2015-01-01 NOTE — Care Management Important Message (Signed)
Important Message  Patient Details  Name: Sheryl Shaw MRN: [id number] Date of Birth: [birth year]   Medicare Important Message Given:  Yes-second notification given    Camillo Flaming 01/01/2015, 11:50 AMImportant Message  Patient Details  Name: Sheryl Shaw MRN: [id number] Date of Birth: 1924/11/22   Medicare Important Message Given:  Yes-second notification given    Camillo Flaming 01/01/2015, 11:50 AM

## 2015-01-01 NOTE — Progress Notes (Signed)
[facility] PROGRESS NOTE    Progress Note   Sheryl Shaw FFM:[id number] DOB: [birth year] DOA: 12/30/2014 PCP: Sheryl Amel, MD   Brief Narrative:   Sheryl Shaw is an 81 y.o. female past medical history of advanced dementia with pneumonia who comes in on this admission for aspiration pneumonia swallowing evaluation done and deemed to high risk for aspiration. Move towards comfort route stop nonessential medications.  Assessment/Plan:   Aspiration pneumonia due to gastric secretions [facility]): - History of esophageal dilation several years ago. - Evaluation was done that showed high risk for aspiration - We'll change Unasyn to Augmentin liquid. As per request of the family. - Had a long discussion with daughter Sheryl Shaw, she decided she wanted her mom to be comfortable, she will like her to eat whatever she wants and she will like to stop nonessential medications. She will like to complete this course of antibiotics but after this she doesn't want her to come back into the hospital for any further treatment. - We'll change her to Augmentin liquid, and stop her Plavix, famotidine, Protonix and narcotics. - Continue oral antibiotics and Zoloft. - The daughter relates that she went to [facility] and she is with a certain degree of certainty sure that they could take her at [facility] with hospice. Will discuss with Education officer, museum. She relates  given a deposit at [facility]. - I Roxanol and Ativan for agitation and comfort.  Essential  HTN (hypertension): Cont current home regimen, BP stable.  Protein-calorie malnutrition, severe (Agar) Nutrition consult.  Acute encephalopathy superimpose on dementia with behavioral disturbance Use haldol when necessary, no evidence overnight.    DVT Prophylaxis - Lovenox ordered.  Family Communication: daughter Disposition Plan: Home when stable. Code Status:     Code Status Orders        Start     Ordered   12/30/14 2123  Do not  attempt resuscitation (DNR)   Continuous    Question Answer Comment  In the event of cardiac or respiratory ARREST Do not call a "code blue"   In the event of cardiac or respiratory ARREST Do not perform Intubation, CPR, defibrillation or ACLS   In the event of cardiac or respiratory ARREST Use medication by any route, position, wound care, and other measures to relive pain and suffering. May use oxygen, suction and manual treatment of airway obstruction as needed for comfort.      12/30/14 2122    Advance Directive Documentation        Most Recent Value   Type of Advance Directive  Out of facility DNR (pink MOST or yellow form)   Pre-existing out of facility DNR order (yellow form or pink MOST form)  Physician notified to receive inpatient order   "MOST" Form in Place?          IV Access:    Peripheral IV   Procedures and diagnostic studies:   Dg Chest 2 View  12/30/2014   CLINICAL DATA:  Hallucinations, stroke, syncope  EXAM: CHEST  2 VIEW  COMPARISON:  04/30/2014, 08/20/2014  FINDINGS: There is hazy bibasilar airspace disease. There is no focal consolidation. There is no pleural effusion or pneumothorax. The heart and mediastinal contours are unremarkable.  The osseous structures are unremarkable.  IMPRESSION: Hazy bibasilar airspace disease which may reflect atelectasis versus pneumonia.   Electronically Signed   By: Kathreen Devoid   On: 12/30/2014 18:30   Ct Head Wo Contrast  12/30/2014   CLINICAL DATA:  Hallucinations.  Symptoms for 6 months.  EXAM: CT HEAD WITHOUT CONTRAST  TECHNIQUE: Contiguous axial images were obtained from the base of the skull through the vertex without intravenous contrast.  COMPARISON:  CT head 12/18/2014.  FINDINGS: No evidence for acute infarction, hemorrhage, mass lesion, hydrocephalus, or extra-axial fluid. Generalized atrophy. Chronic microvascular ischemic change.  No skull fracture. Sequelae from previous scalp hematoma and laceration near the vertex  RIGHT parietal region as seen on image 27 were likely placed 12/18/2014, and could be ready for removal. No new scalp hematoma is evident.  No sinus or mastoid disease. Negative orbits. BILATERAL cataract extraction.  Stable appearance from priors.  IMPRESSION: Atrophy and small vessel disease.  No acute intracranial findings.  Sequelae of previous scalp laceration RIGHT parietal region near the vertex. These staples could be ready for removal.   Electronically Signed   By: Staci Righter M.D.   On: 12/30/2014 18:32     Medical Consultants:    None.  Anti-Infectives:   Anti-infectives    Start     Dose/Rate Route Frequency Ordered Stop   12/31/14 0900  ampicillin-sulbactam (UNASYN) 1.5 g in sodium chloride 0.9 % 50 mL IVPB     1.5 g 100 mL/hr over 30 Minutes Intravenous Every 6 hours 12/31/14 0755     12/30/14 1930  vancomycin (VANCOCIN) IVPB 1000 mg/200 mL premix     1,000 mg 200 mL/hr over 60 Minutes Intravenous  Once 12/30/14 1915 12/30/14 2144   12/30/14 1930  piperacillin-tazobactam (ZOSYN) IVPB 3.375 g     3.375 g 100 mL/hr over 30 Minutes Intravenous  Once 12/30/14 1915 12/30/14 2231      Subjective:    [employer] nonverbal, had no events overnight.  Objective:    Filed Vitals:   12/31/14 1420 12/31/14 2027 01/01/15 0210 01/01/15 0552  BP: 146/68 105/39 96/66 110/81  Pulse: 66 61 72 63  Temp: 97.3 F (36.3 C) 97.6 F (36.4 C) 97.5 F (36.4 C) 97.6 F (36.4 C)  TempSrc: Oral Oral Oral Oral  Resp: 18 18 18 18   Height:      Weight:      SpO2: 100% 100% 100% 100%    Intake/Output Summary (Last 24 hours) at 01/01/15 0809 Last data filed at 01/01/15 0555  Gross per 24 hour  Intake   1512 ml  Output   1425 ml  Net     87 ml   Filed Weights   12/30/14 2126  Weight: 43.727 kg (96 lb 6.4 oz)    Exam: Gen:  NAD, nonverbal Cardiovascular:  RRR, No M/R/G Respiratory:  Lungs CTAB Gastrointestinal:  Abdomen soft, NT/ND, + BS Extremities:  No  C/E/C   Data Reviewed:    Labs: Basic Metabolic Panel:  Recent Labs Lab 12/30/14 1750 12/31/14 0422  NA 139 139  K 4.1 3.7  CL 105 105  CO2 28 27  GLUCOSE 93 94  BUN 21* 20  CREATININE 0.69 0.66  CALCIUM 8.7* 8.4*   GFR Estimated Creatinine Clearance: 33.5 mL/min (by C-G formula based on Cr of 0.66). Liver Function Tests: No results for input(s): AST, ALT, ALKPHOS, BILITOT, PROT, ALBUMIN in the last 168 hours. No results for input(s): LIPASE, AMYLASE in the last 168 hours. No results for input(s): AMMONIA in the last 168 hours. Coagulation profile No results for input(s): INR, PROTIME in the last 168 hours.  CBC:  Recent Labs Lab 12/30/14 1750 12/31/14 0422  WBC 14.3* 9.3  HGB 11.2* 10.3*  HCT 33.9* 32.0*  MCV 87.8 87.9  PLT 390 362   Cardiac Enzymes: No results for input(s): CKTOTAL, CKMB, CKMBINDEX, TROPONINI in the last 168 hours. BNP (last 3 results) No results for input(s): PROBNP in the last 8760 hours. CBG: No results for input(s): GLUCAP in the last 168 hours. D-Dimer: No results for input(s): DDIMER in the last 72 hours. Hgb A1c: No results for input(s): HGBA1C in the last 72 hours. Lipid Profile: No results for input(s): CHOL, HDL, LDLCALC, TRIG, CHOLHDL, LDLDIRECT in the last 72 hours. Thyroid function studies: No results for input(s): TSH, T4TOTAL, T3FREE, THYROIDAB in the last 72 hours.  Invalid input(s): FREET3 Anemia work up: No results for input(s): VITAMINB12, FOLATE, FERRITIN, TIBC, IRON, RETICCTPCT in the last 72 hours. Sepsis Labs:  Recent Labs Lab 12/30/14 1750 12/31/14 0422  WBC 14.3* 9.3   Microbiology No results found for this or any previous visit (from the past 240 hour(s)).   Medications:   . ampicillin-sulbactam (UNASYN) IV  1.5 g Intravenous Q6H  . atenolol  50 mg Oral Daily  . clopidogrel  75 mg Oral Daily  . enoxaparin (LOVENOX) injection  30 mg Subcutaneous QHS  . famotidine  20 mg Oral Daily  . feeding  supplement (ENSURE ENLIVE)  1 Bottle Oral TID BM  . furosemide  20 mg Oral Daily  . lisinopril  10 mg Oral Daily  . pantoprazole  20 mg Oral Daily  . potassium chloride  10 mEq Oral Daily  . sertraline  25 mg Oral QHS  . zinc sulfate  220 mg Oral Daily   Continuous Infusions: . sodium chloride 75 mL/hr at 01/01/15 0355    Time spent: 25 min   LOS: 2 days   Charlynne Cousins  [facility] Pager [phone]  *Please refer to [email or web address], password [email or web address] to get updated schedule on who will round on this patient, as hospitalists switch teams weekly. If 7PM-7AM, please contact night-coverage at [email or web address], password [email or web address] for any overnight needs.  01/01/2015, 8:09 AM

## 2015-01-01 NOTE — Clinical Social Work Note (Signed)
Clinical Social Work Assessment  Patient Details  Name: Sheryl Shaw MRN: [id number] Date of Birth: 1924/07/27  Date of referral:  01/01/15               Reason for consult:  Facility Placement                Permission sought to share information with:  Family Supports Permission granted to share information::  Yes, Verbal Permission Granted  Name::     Wilber Oliphant  Agency::  [facility]   Relationship::  daughter  Contact Information:  [phone]  Housing/Transportation Living arrangements for the past 2 months:  [place] of Information:  Adult Children Patient Interpreter Needed:  None Criminal Activity/Legal Involvement Pertinent to Current Situation/Hospitalization:  No - Comment as needed Significant Relationships:  Adult Children Lives with:  Facility Resident (previouslly resided at [facility] ) Do you feel safe going back to the place where you live?  Yes Need for family participation in patient care:  Yes (Comment)  Care giving concerns:  Pt from [facility] al[facility], family reuqesting new alf memory care unit   Social Worker assessment / plan:  CSW colleague spoke with pt daughter who confirmed patient is from [facility] [address] but requesting a new assisted living facility. CSW confirmed pt is accepted to [facility] memory care.  Employment status:  Retired Forensic scientist:  Medicare PT Recommendations:  Not assessed at this time Information / Referral to community resources:     Patient/Family's Response to care:  Pt family thanked csw for concern and support  Patient/Family's Understanding of and Emotional Response to Diagnosis, Current Treatment, and Prognosis:  Pt daughter verablized understanding of need for continued care at assisted living, memory care with hospice to follow.  Emotional Assessment Appearance:  Appears stated age Attitude/Demeanor/Rapport:  Unable to Assess Affect (typically observed):  Unable to  Assess Orientation:  Oriented to Self, Oriented to Place, Oriented to  Time Alcohol / Substance use:  Not Applicable Psych involvement (Current and /or in the community):  No (Comment)  Discharge Needs  Concerns to be addressed:  Discharge Planning Concerns Readmission within the last 30 days:  No Current discharge risk:  None Barriers to Discharge:  No Barriers Identified   Cordarrius Coad, [place], LCSW 01/01/2015, 2:41 PM

## 2015-01-02 DIAGNOSIS — J690 Pneumonitis due to inhalation of food and vomit: Secondary | ICD-10-CM

## 2015-01-02 DIAGNOSIS — I10 Essential (primary) hypertension: Secondary | ICD-10-CM

## 2015-01-02 DIAGNOSIS — E43 Unspecified severe protein-calorie malnutrition: Secondary | ICD-10-CM

## 2015-01-02 DIAGNOSIS — G9340 Encephalopathy, unspecified: Secondary | ICD-10-CM

## 2015-01-02 DIAGNOSIS — R443 Hallucinations, unspecified: Secondary | ICD-10-CM

## 2015-01-02 DIAGNOSIS — F0391 Unspecified dementia with behavioral disturbance: Secondary | ICD-10-CM

## 2015-01-02 MED ORDER — MORPHINE SULFATE (CONCENTRATE) 10 MG/0.5ML PO SOLN: 10.0000 mg | ORAL | Status: DC | PRN

## 2015-01-02 MED ORDER — RESOURCE THICKENUP CLEAR PO POWD: ORAL | Status: DC

## 2015-01-02 MED ORDER — AMOXICILLIN-POT CLAVULANATE 400-57 MG/5ML PO SUSR: 800.0000 mg | Freq: Two times a day (BID) | ORAL | Status: DC

## 2015-01-02 MED ORDER — LORAZEPAM 1 MG PO TABS: 1.0000 mg | ORAL_TABLET | ORAL | Status: AC | PRN

## 2015-01-02 NOTE — Care Management Note (Signed)
Case Management Note  Patient Details  Name: Sheryl Shaw MRN: [id number] Date of Birth: 1925/01/15  Subjective/Objective:      81 yo admitted with Aspiration PNA.              Action/Plan: From [facility], discharging to [facility] with hospice.  Expected Discharge Date:   (UNKNOWN)               Expected Discharge Plan:  Assisted Living / Rest Home  In-House Referral:  Clinical Social Work  Discharge planning Services  CM Consult  Post Acute Care Choice:    Choice offered to:     DME Arranged:    DME Agency:     HH Arranged:    HH Agency:     Status of Service:  In process, will continue to follow  Medicare Important Message Given:  Yes-second notification given Date Medicare IM Given:    Medicare IM give by:    Date Additional Medicare IM Given:    Additional Medicare Important Message give by:     If discussed at [place] of Stay Meetings, dates discussed:    Additional Comments: Per CSW, family has been communicating with [facility] Hospice representative for hospice services at the ALF. This CM contacted [facility] to inquire if referral had been made. Rep confirms that they will be active with pt at DC and this CM provided rep with paperwork needed for referral. No other dc needs noted. Lynnell Catalan, RN 01/02/2015, 11:49 AM

## 2015-01-02 NOTE — Progress Notes (Signed)
Pt for discharge to [facility] AL[facility] memory care unit.   CSW facilitated pt discharge needs including contacting facility, faxing pt discharge information to the facility, receiving notification from facility that facility received discharge information and pt can come to facility, providing RN phone number to call report, and discussing with pt daughter, [name] at bedside. Pt daughter plans to transport via private vehicle. CSW provided pt daughter with discharge packet to provide to [facility] upon arrival to facility.  No further social work needs identified at this time.  CSW signing off.   Alison Murray, MSW, [place] Work [phone]

## 2015-01-02 NOTE — Discharge Summary (Addendum)
Physician Discharge Summary  Sheryl Shaw LKG:[id number] DOB: 12/05/1924 DOA: 12/30/2014  PCP: Lujean Amel, MD  Admit date: 12/30/2014 Discharge date: 01/05/2015  Recommendations for Outpatient Follow-up:  1. Transfer to ALF with hospice care/ end of life care 2. Continue augmentin through 01/07/15, then stop  3. If not drinking or eating, consider liberalizing to a dysphagia 2 with nectar or thin liquids  Discharge Diagnoses:  Principal Problem:   Aspiration pneumonia due to gastric secretions ([place]) Active Problems:   Essential hypertension   Protein-calorie malnutrition, severe ([facility])   Acute encephalopathy   Dementia with behavioral disturbance   Dementia without behavioral disturbance   Hallucinations   Discharge Condition: fair  Diet recommendation: Dysphagia 1 with honey thick liquids, medications crushed with puree  Dysphagia 1 (Puree);Honey   Medication Administration: Crushed with puree Compensations: Slow rate;Small sips/bites   Wt Readings from Last 3 Encounters:  12/30/14 43.727 kg (96 lb 6.4 oz)  06/25/14 51.256 kg (113 lb)  06/21/14 53.524 kg (118 lb)    History of present illness:  The patient is an 81 year old female with history of hypertension, previous stroke, dementia who presented to the emergency department with increased confusion, decreased responsiveness, hallucinations. In the emergency department, she was found to have bibasilar pneumonia on chest x-ray and was placed on IV vancomycin and Zosyn for sepsis.  Hospital Course:   Aspiration pneumonia. She has a history of an esophageal stricture and had an esophageal dilation several years ago. She was seen by speech therapy who felt that she was high risk for oral pharyngeal aspiration. She was started on Unasyn, dysphagia 1 diet with honey thick liquids. She should complete a seven-day course of antibiotics for possible aspiration pneumonia, last day on 01/07/2015. Her PPI was discontinued  secondary to her aspiration.  She stopped eating and drinking as much and was eating and drinking minimally at the time of discharge. If she continues to not eat, she will likely die in the next several days or couple of weeks.  Per discussion with family, her medications were minimized to those exclusively for symptom management.  She will be cared for at a memory unit with hospice care with anticipate of end of life care soon.  Essential hypertension, blood pressure stable. Stopped all medications not directly related to comfort.  Severe protein calorie malnutrition, not eating or drinking. She was prescribed ensure twice daily.  If she continues to not eat or drink, her prognosis is limited.  Hospice care as above.  Acute encephalopathy, likely delirium secondary to aspiration pneumonia superimposed on dementia with behavioral disturbance.  She continued to have confusion but she did not have significant agitation or aggressive behaviors.    Normocytic anemia, mild and no evidence of obvious bleeding.  No further work up at this time.     Procedures:  CXR  CT head  Consultations:  Speech therapy  Nutrition  Discharge Exam: Filed Vitals:   01/02/15 1052  BP: 134/64  Pulse: 77  Temp: 97.9 F (36.6 C)  Resp: 20   Filed Vitals:   01/01/15 0942 01/01/15 1256 01/01/15 2109 01/02/15 1052  BP: 135/61 106/60 145/61 134/64  Pulse: 73 64 64 77  Temp: 97.5 F (36.4 C) 97.1 F (36.2 C) 97.4 F (36.3 C) 97.9 F (36.6 C)  TempSrc: Oral Oral Oral Oral  Resp: 16 15 18 20   Height:      Weight:      SpO2: 99% 98% 99% 98%    General: Cachectic adult female, confused,  no acute distress Cardiovascular: Regular rate and rhythm, no murmurs, rubs, or gallops Respiratory: Clear to auscultation anteriorly Abdomen: NABS, nontender MSK: Decreased tone and bulk  Discharge Instructions      Discharge Instructions    Call MD for:  difficulty breathing, headache or visual disturbances     Complete by:  As directed      Call MD for:  extreme fatigue    Complete by:  As directed      Call MD for:  hives    Complete by:  As directed      Call MD for:  persistant dizziness or light-headedness    Complete by:  As directed      Call MD for:  persistant nausea and vomiting    Complete by:  As directed      Call MD for:  severe uncontrolled pain    Complete by:  As directed      Call MD for:  temperature >100.4    Complete by:  As directed      Increase activity slowly    Complete by:  As directed             Medication List    STOP taking these medications        acetaminophen 650 MG CR tablet  Commonly known as:  TYLENOL 8 HOUR     ALPRAZolam 0.25 MG tablet  Commonly known as:  XANAX     atenolol 50 MG tablet  Commonly known as:  TENORMIN     clopidogrel 75 MG tablet  Commonly known as:  PLAVIX     furosemide 20 MG tablet  Commonly known as:  LASIX     KLOR-CON M10 10 MEQ tablet  Generic drug:  potassium chloride     lisinopril 10 MG tablet  Commonly known as:  PRINIVIL,ZESTRIL     Melatonin 3 MG Tabs     pantoprazole 20 MG tablet  Commonly known as:  PROTONIX     ranitidine 75 MG tablet  Commonly known as:  ZANTAC      TAKE these medications        amoxicillin-clavulanate 400-57 MG/5ML suspension  Commonly known as:  AUGMENTIN  Take 10 mLs (800 mg total) by mouth every 12 (twelve) hours.     ENSURE PO  Take 1 Bottle by mouth 3 (three) times daily. Take 1 shake 900,1700,and 2100     LORazepam 1 MG tablet  Commonly known as:  ATIVAN  Take 1 tablet (1 mg total) by mouth every 4 (four) hours as needed for anxiety or sedation. Crush with puree     morphine CONCENTRATE 10 MG/0.5ML Soln concentrated solution  Take 0.5 mLs (10 mg total) by mouth every 2 (two) hours as needed for severe pain.     RESOURCE THICKENUP CLEAR Powd  Honey thick     sertraline 25 MG tablet  Commonly known as:  ZOLOFT  Take 25 mg by mouth at bedtime.     Zinc 50  MG Caps  Take 50 mg by mouth daily.       Follow-up Information    Follow up with Lujean Amel, MD. Schedule an appointment as soon as possible for a visit in 2 weeks.   Specialty:  Family Medicine   Contact information:   [place] [address] [phone]        The results of significant diagnostics from this hospitalization (including imaging, microbiology, ancillary and laboratory) are  listed below for reference.    Significant Diagnostic Studies: Dg Chest 2 View  12/30/2014   CLINICAL DATA:  Hallucinations, stroke, syncope  EXAM: CHEST  2 VIEW  COMPARISON:  04/30/2014, 08/20/2014  FINDINGS: There is hazy bibasilar airspace disease. There is no focal consolidation. There is no pleural effusion or pneumothorax. The heart and mediastinal contours are unremarkable.  The osseous structures are unremarkable.  IMPRESSION: Hazy bibasilar airspace disease which may reflect atelectasis versus pneumonia.   Electronically Signed   By: Kathreen Devoid   On: 12/30/2014 18:30   Ct Head Wo Contrast  12/30/2014   CLINICAL DATA:  Hallucinations.  Symptoms for 6 months.  EXAM: CT HEAD WITHOUT CONTRAST  TECHNIQUE: Contiguous axial images were obtained from the base of the skull through the vertex without intravenous contrast.  COMPARISON:  CT head 12/18/2014.  FINDINGS: No evidence for acute infarction, hemorrhage, mass lesion, hydrocephalus, or extra-axial fluid. Generalized atrophy. Chronic microvascular ischemic change.  No skull fracture. Sequelae from previous scalp hematoma and laceration near the vertex RIGHT parietal region as seen on image 27 were likely placed 12/18/2014, and could be ready for removal. No new scalp hematoma is evident.  No sinus or mastoid disease. Negative orbits. BILATERAL cataract extraction.  Stable appearance from priors.  IMPRESSION: Atrophy and small vessel disease.  No acute intracranial findings.  Sequelae of previous scalp laceration  RIGHT parietal region near the vertex. These staples could be ready for removal.   Electronically Signed   By: Staci Righter M.D.   On: 12/30/2014 18:32   Ct Head Wo Contrast  12/18/2014   CLINICAL DATA:  Status post fall today with a blow to the head. Hematoma on the back of the head.  EXAM: CT HEAD WITHOUT CONTRAST  CT CERVICAL SPINE WITHOUT CONTRAST  TECHNIQUE: Multidetector CT imaging of the head and cervical spine was performed following the standard protocol without intravenous contrast. Multiplanar CT image reconstructions of the cervical spine were also generated.  COMPARISON:  Head and cervical spine CT scans 11/23/2014.  FINDINGS: CT HEAD FINDINGS  The brain is atrophic with chronic microvascular ischemic change. Scalp hematoma is seen posteriorly. There is no evidence of acute intracranial abnormality including hemorrhage, infarct, mass lesion, mass effect, midline shift or abnormal extra-axial fluid collection. No hydrocephalus or pneumocephalus. No fracture is identified.  CT CERVICAL SPINE FINDINGS  There is no fracture or malalignment of the cervical spine. Marked loss of disc space height is seen at C5-6 and C6-7 scattered facet degenerative disease is also identified. There is some scarring in the lung apices.  IMPRESSION: Scalp hematoma posteriorly without underlying fracture or acute intracranial abnormality.  No acute abnormality cervical spine.  Atrophy and chronic microvascular ischemic change.  Cervical spondylosis.   Electronically Signed   By: Inge Rise M.D.   On: 12/18/2014 11:55   Ct Cervical Spine Wo Contrast  12/18/2014   CLINICAL DATA:  Status post fall today with a blow to the head. Hematoma on the back of the head.  EXAM: CT HEAD WITHOUT CONTRAST  CT CERVICAL SPINE WITHOUT CONTRAST  TECHNIQUE: Multidetector CT imaging of the head and cervical spine was performed following the standard protocol without intravenous contrast. Multiplanar CT image reconstructions of the  cervical spine were also generated.  COMPARISON:  Head and cervical spine CT scans 11/23/2014.  FINDINGS: CT HEAD FINDINGS  The brain is atrophic with chronic microvascular ischemic change. Scalp hematoma is seen posteriorly. There is no evidence of acute intracranial abnormality  including hemorrhage, infarct, mass lesion, mass effect, midline shift or abnormal extra-axial fluid collection. No hydrocephalus or pneumocephalus. No fracture is identified.  CT CERVICAL SPINE FINDINGS  There is no fracture or malalignment of the cervical spine. Marked loss of disc space height is seen at C5-6 and C6-7 scattered facet degenerative disease is also identified. There is some scarring in the lung apices.  IMPRESSION: Scalp hematoma posteriorly without underlying fracture or acute intracranial abnormality.  No acute abnormality cervical spine.  Atrophy and chronic microvascular ischemic change.  Cervical spondylosis.   Electronically Signed   By: Inge Rise M.D.   On: 12/18/2014 11:55    Microbiology: Recent Results (from the past 240 hour(s))  Culture, blood (routine x 2)     Status: None   Collection Time: 12/30/14  7:58 PM  Result Value Ref Range Status   Specimen Description BLOOD BLOOD LEFT FOREARM  Final   Special Requests BOTTLES DRAWN AEROBIC AND ANAEROBIC 5CC  Final   Culture   Final    NO GROWTH 5 DAYS Performed at [facility]    Report Status 01/05/2015 FINAL  Final  Culture, blood (routine x 2)     Status: None   Collection Time: 12/30/14  8:05 PM  Result Value Ref Range Status   Specimen Description BLOOD LEFT ANTECUBITAL  Final   Special Requests IN PEDIATRIC BOTTLE 4CC  Final   Culture   Final    NO GROWTH 5 DAYS Performed at [facility]    Report Status 01/05/2015 FINAL  Final     Labs: Basic Metabolic Panel:  Recent Labs Lab 12/30/14 1750 12/31/14 0422  NA 139 139  K 4.1 3.7  CL 105 105  CO2 28 27  GLUCOSE 93 94  BUN 21* 20  CREATININE 0.69 0.66   CALCIUM 8.7* 8.4*   Liver Function Tests: No results for input(s): AST, ALT, ALKPHOS, BILITOT, PROT, ALBUMIN in the last 168 hours. No results for input(s): LIPASE, AMYLASE in the last 168 hours. No results for input(s): AMMONIA in the last 168 hours. CBC:  Recent Labs Lab 12/30/14 1750 12/31/14 0422  WBC 14.3* 9.3  HGB 11.2* 10.3*  HCT 33.9* 32.0*  MCV 87.8 87.9  PLT 390 362   Cardiac Enzymes: No results for input(s): CKTOTAL, CKMB, CKMBINDEX, TROPONINI in the last 168 hours. BNP: BNP (last 3 results) No results for input(s): BNP in the last 8760 hours.  ProBNP (last 3 results) No results for input(s): PROBNP in the last 8760 hours.  CBG: No results for input(s): GLUCAP in the last 168 hours.  Time coordinating discharge: 35 minutes  Signed:  Taler Kushner  [facility] 01/05/2015, 6:21 PM

## 2015-01-02 NOTE — Progress Notes (Signed)
Speech Language Pathology Treatment:    Patient Details Name: Sheryl Shaw MRN: [id number] DOB: 1924/11/30 Today's Date: 01/02/2015 Time: 9381-8299 SLP Time Calculation (min) (ACUTE ONLY): 15 min  Assessment / Plan / Recommendation Clinical Impression  Pt seated upright in bed, finishing lunch. Daughter present. Minimal intake noted. Intermittent wet voice quality noted following honey thick liquids. Per daughter, pt is scheduled to be discharged today. Pt suspected to be at considerable aspiration risk. Conservative diet of puree and thickened liquids continues to be recommended.    HPI Other Pertinent Information: 81 y.o. female with a history of HTN, CVA, GERD, TIA's, optic nerve disorder, chronic fatigue admitted due to increased confusion, hallucinating and per MD notes talking about going "home" as in to [place].CXR hazy bibasilar airspace disease which may reflect atelectasis versus pneumonia. EGD 06/25/14 revealed mild distal esophageal stricture. Status post dilatation with Savary dilators from 12-16 mm, small sliding hiatal hernia. BSE 05/03/14. Limited BSE 12/31/14 with suspected aspiration risk. Pt placed on Dys1/HTL   Pertinent Vitals Pain Assessment: No/denies pain      Recommendations Diet recommendations: Dysphagia 1 (puree);Honey-thick liquid Liquids provided via: Teaspoon Medication Administration: Crushed with puree Supervision: Full supervision/cueing for compensatory strategies;Trained caregiver to feed patient Compensations: Slow rate;Small sips/bites Postural Changes and/or Swallow Maneuvers: Upright 30-60 min after meal;Seated upright 90 degrees              Oral Care Recommendations: Oral care BID Follow up Recommendations: 24 hour supervision/assistance    GO     Shonna Chock 01/02/2015, 12:55 PM  Ladaija Dimino B. Quentin Ore [facility], [place] [phone]

## 2015-01-02 NOTE — Progress Notes (Signed)
   01/02/2015   To Whom It May Concern,  Sheryl Shaw was admitted [facility] from 12/30/2014 to 01/02/2015.  She was discharged to hospice care.  Please excuse her daughter Sheryl Shaw from work through 01/09/2015.    Sincerely,    Janece Canterbury, MD [facility] [address], [place]  [address]  Ph:    [phone] Fax:  [phone]

## 2015-01-05 LAB — CULTURE, BLOOD (ROUTINE X 2)
Culture: NO GROWTH
Culture: NO GROWTH

## 2015-05-13 ENCOUNTER — Emergency Department (HOSPITAL_COMMUNITY)
Admission: EM | Admit: 2015-05-13 | Discharge: 2015-05-13 | Disposition: A | Discharge status: Home / Self Care | Payer: Medicare Other | Attending: Emergency Medicine | Admitting: Emergency Medicine

## 2015-05-13 ENCOUNTER — Encounter (HOSPITAL_COMMUNITY): Payer: Self-pay | Admitting: Emergency Medicine

## 2015-05-13 DIAGNOSIS — C4440 Unspecified malignant neoplasm of skin of scalp and neck: Secondary | ICD-10-CM

## 2015-05-13 DIAGNOSIS — Z8669 Personal history of other diseases of the nervous system and sense organs: Secondary | ICD-10-CM | POA: Diagnosis not present

## 2015-05-13 DIAGNOSIS — Z8719 Personal history of other diseases of the digestive system: Secondary | ICD-10-CM | POA: Insufficient documentation

## 2015-05-13 DIAGNOSIS — Z79899 Other long term (current) drug therapy: Secondary | ICD-10-CM | POA: Diagnosis not present

## 2015-05-13 DIAGNOSIS — C4442 Squamous cell carcinoma of skin of scalp and neck: Secondary | ICD-10-CM | POA: Insufficient documentation

## 2015-05-13 DIAGNOSIS — Z8673 Personal history of transient ischemic attack (TIA), and cerebral infarction without residual deficits: Secondary | ICD-10-CM | POA: Diagnosis not present

## 2015-05-13 DIAGNOSIS — R220 Localized swelling, mass and lump, head: Secondary | ICD-10-CM | POA: Diagnosis present

## 2015-05-13 DIAGNOSIS — I10 Essential (primary) hypertension: Secondary | ICD-10-CM | POA: Diagnosis not present

## 2015-05-13 DIAGNOSIS — Z792 Long term (current) use of antibiotics: Secondary | ICD-10-CM | POA: Diagnosis not present

## 2015-05-13 DIAGNOSIS — Z862 Personal history of diseases of the blood and blood-forming organs and certain disorders involving the immune mechanism: Secondary | ICD-10-CM | POA: Diagnosis not present

## 2015-05-13 DIAGNOSIS — I2510 Atherosclerotic heart disease of native coronary artery without angina pectoris: Secondary | ICD-10-CM | POA: Insufficient documentation

## 2015-05-13 MED ORDER — BACITRACIN ZINC 500 UNIT/GM EX OINT
1.0000 "application " | TOPICAL_OINTMENT | Freq: Two times a day (BID) | CUTANEOUS | Status: DC
  Filled 2015-05-13: qty 0.9

## 2015-05-13 NOTE — Discharge Instructions (Signed)
Excision of Skin Lesions °Excision of a skin lesion refers to the removal of a section of skin by making small cuts (incisions) in the skin. This procedure may be done to remove a cancerous (malignant) or noncancerous (benign) growth on the skin. It is typically done to treat or prevent cancer or infection. It may also be done to improve cosmetic appearance. °The procedure may be done to remove: °· Cancerous growths, such as basal cell carcinoma, squamous cell carcinoma, or melanoma. °· Noncancerous growths, such as a cyst or lipoma. °· Growths, such as moles or skin tags, which may be removed for cosmetic reasons. °Various excision or surgical techniques may be used depending on your condition, the location of the lesion, and your overall health. °LET YOUR HEALTH CARE PROVIDER KNOW ABOUT: °· Any allergies you have. °· All medicines you are taking, including vitamins, herbs, eye drops, creams, and over-the-counter medicines. °· Previous problems you or members of your family have had with the use of anesthetics. °· Any blood disorders you have. °· Previous surgeries you have had. °· Any medical conditions you have. °· Whether you are pregnant or may be pregnant. °RISKS AND COMPLICATIONS °Generally, this is a safe procedure. However, problems may occur, including: °· Bleeding. °· Infection. °· Scarring. °· Recurrence of the cyst, lipoma, or cancer. °· Changes in skin sensation or appearance, such as discoloration or swelling. °· Reaction to the anesthetics. °· Allergic reaction to surgical materials or ointments. °· Damage to nerves, blood vessels, muscles, or other structures. °· Continued pain. °BEFORE THE PROCEDURE °· Ask your health care provider about: °¨ Changing or stopping your regular medicines. This is especially important if you are taking diabetes medicines or blood thinners. °¨ Taking medicines such as aspirin and ibuprofen. These medicines can thin your blood. Do not take these medicines before your  procedure if your health care provider instructs you not to. °· You may be asked to take certain medicines. °· You may be asked to stop smoking. °· You may have an exam or testing. °· Plan to have someone take you home after the procedure. °· Plan to have someone help you with activities during recovery. °PROCEDURE °· To reduce your risk of infection: °¨ Your health care team will wash or sanitize their hands. °¨ Your skin will be washed with soap. °· You will be given a medicine to numb the area (local anesthetic). °· One of the following excision techniques will be performed. °· At the end of any of these procedures, antibiotic ointment will be applied as needed. °Each of the following techniques may vary among health care providers and hospitals. °Complete Surgical Excision °The area of skin that needs to be removed will be marked with a pen. Using a small scalpel or scissors, the surgeon will gently cut around and under the lesion until it is completely removed. The lesion will be placed in a fluid and sent to the lab for examination. If necessary, bleeding will be controlled with a device that delivers heat (electrocautery). The edges of the wound may be stitched (sutured) together, and a bandage (dressing) will be applied. This procedure may be performed to treat a cancerous growth or a noncancerous cyst or lesion. °Excision of a Cyst °The surgeon will make an incision on the cyst. The entire cyst will be removed through the incision. The incision may be closed with sutures. °Shave Excision °During shave excision, the surgeon will use a small blade or an electrically heated loop instrument to   shave off the lesion. This may be done to remove a mole or a skin tag. The wound will usually be left to heal on its own without sutures. °Punch Excision °During punch excision, the surgeon will use a small tool that is like a cookie cutter or a hole punch to cut a circle shape out of the skin. The outer edges of the skin  will be sutured together. This may be done to remove a mole or a scar or to perform a biopsy of the lesion. °Mohs Micrographic Surgery °During Mohs micrographic surgery, layers of the lesion will be removed with a scalpel or a loop instrument and will be examined right away under a microscope. Layers will be removed until all of the abnormal or cancerous tissue has been removed. This procedure is minimally invasive, and it ensures the best cosmetic outcome. It involves the removal of as Sheryl Shaw normal tissue as possible. Mohs is usually done to treat skin cancer, such as basal cell carcinoma or squamous cell carcinoma, particularly on the face and ears. Depending on the size of the surgical wound, it may be sutured closed. °AFTER THE PROCEDURE °· Return to your normal activities as told by your health care provider. °· Talk with your health care provider to discuss any test results, treatment options, and if necessary, the need for more tests. °  °This information is not intended to replace advice given to you by your health care provider. Make sure you discuss any questions you have with your health care provider. °  °Document Released: 06/10/2009 Document Revised: 12/05/2014 Document Reviewed: 05/02/2014 °Elsevier Interactive Patient Education ©2016 [employer] ° °

## 2015-05-13 NOTE — ED Notes (Signed)
Pt from [facility]. Per staff pt had non traumatic hematoma on top of head for a few days and today it started bleeding. Facility sts that she had surgery on it at some point but can not say when. No fall or injury. A&Ox4. Pt does not have hearing aids in today. Daughter is POA. Denies pain.
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
## 2015-05-13 NOTE — ED Provider Notes (Signed)
CSN: [id number]     Arrival date & time 05/13/15  [id number] History   First MD Initiated Contact with Patient 05/13/15 0735     Chief Complaint  Patient presents with  . Head Injury    Hematoma     (Consider location/radiation/quality/duration/timing/severity/associated sxs/prior Treatment) HPI Comments: 82 year old F with past medical history below who presents with scalp bleeding. History obtained with the assistance of the patient's daughters. 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 The area has been rapidly growing and she has an upcoming dermatology appointment for removal in March. Today at her nursing facility, staff noted that she began having bleeding from this area. Daughters state that the patient does have a habit of picking at the skin in that area. No fall or recent injury. Patient is concerned that she has a staple left over from last October. Patient denies any pain. Daughters report that she is at her neurologic baseline. No anticoagulant use.  Patient is a 82 y.o. female presenting with head injury. The history is provided by the patient.  Head Injury   Past Medical History  Diagnosis Date  . Hypertension   . GERD (gastroesophageal reflux disease)   . Palpitations   . Chronic fatigue   . Syncope 2007  . Vertebrobasilar TIAs   . Optic nerve disorder     right  . Nausea   . Arteriosclerotic heart disease   . DNR (do not resuscitate)   . Anemia   . Stroke [facility])     TIA  . Cataract    Past Surgical History  Procedure Laterality Date  . Rectal polypectomy    . Cataract extraction, bilateral    . Transthoracic echocardiogram  04/02/2005    EF 50-60%  . Esophageal dilitation    . Upper gastrointestinal endoscopy    . Colonoscopy    . Polypectomy    . Cholecystectomy     Family History  Problem Relation Age of Onset  . Heart disease Father   . Heart attack Father   . Heart disease Brother   . Heart disease  Brother   . Heart disease Brother   . Heart disease Brother   . Heart disease Brother   . Heart attack Brother   . Heart attack Brother   . Heart attack Brother   . Colon cancer Neg Hx   . Rectal cancer Neg Hx   . Stomach cancer Neg Hx    Social History  Substance Use Topics  . Smoking status: Never Smoker   . Smokeless tobacco: Never Used  . Alcohol Use: No   OB History    No data available     Review of Systems 10 Systems reviewed and are negative for acute change except as noted in the HPI.    Allergies  Codeine and Mucinex  Home Medications   Prior to Admission medications   Medication Sig Start Date End Date Taking? Authorizing Provider  amoxicillin-clavulanate (AUGMENTIN) 400-57 MG/5ML suspension Take 10 mLs (800 mg total) by mouth every 12 (twelve) hours. 01/02/15   Janece Canterbury, MD  LORazepam (ATIVAN) 1 MG tablet Take 1 tablet (1 mg total) by mouth every 4 (four) hours as needed for anxiety or sedation. Crush with puree 01/02/15   Janece Canterbury, MD  Maltodextrin-Xanthan Gum [facility] CLEAR) POWD Honey thick 01/02/15   Janece Canterbury, MD  Morphine Sulfate (MORPHINE CONCENTRATE) 10 MG/0.5ML SOLN concentrated solution Take 0.5 mLs (  10 mg total) by mouth every 2 (two) hours as needed for severe pain. 01/02/15   Janece Canterbury, MD  Nutritional Supplements (ENSURE PO) Take 1 Bottle by mouth 3 (three) times daily. Take 1 shake 900,1700,and 2100    Historical Provider, MD  sertraline (ZOLOFT) 25 MG tablet Take 25 mg by mouth at bedtime.  10/17/14   Historical Provider, MD  Zinc 50 MG CAPS Take 50 mg by mouth daily.    Historical Provider, MD   BP 164/68 mmHg  Pulse 65  Temp(Src) 97.4 F (36.3 C) (Oral)  Resp 14  SpO2 100% Physical Exam  Constitutional: She is oriented to person, place, and time.  Thin, frail elderly woman in NAD  HENT:  Head: Normocephalic and atraumatic.  Mouth/Throat: Oropharynx is clear and moist.  Eyes: Conjunctivae are normal.  Pupils are equal, round, and reactive to light.  Neck: Neck supple.  Cardiovascular: Normal rate and regular rhythm.   Pulmonary/Chest: Effort normal and breath sounds normal.  Neurological: She is alert and oriented to person, place, and time.  Hard of hearing  Skin:  3cm round growth on top of scalp w/ dried blood and scaling skin; no active bleeding, no TTP  Psychiatric: She has a normal mood and affect.  Nursing note and vitals reviewed.   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 On exam, she had dried blood on the growth at the top of her scalp with no active bleeding. Nursing staff cleaned wound and apply bacitracin. Discussed supportive care and instructed to contact dermatologist for sooner appointment for removal. Discussed return precautions and family voiced understanding.    Sharlett Iles, MD 05/13/15 [phone]

## 2015-05-13 NOTE — ED Notes (Signed)
Bed: [id number] Expected date:  Expected time:  Means of arrival:  Comments: Hematoma, head bleeding

## 2015-05-22 ENCOUNTER — Encounter (HOSPITAL_COMMUNITY): Payer: Self-pay | Admitting: *Deleted

## 2015-05-22 NOTE — Progress Notes (Signed)
Pt is a resident at [facility] at [facility], [place] to [employer]. Pt has dementia and hospice is involved in her care. Spoke with pt's daughter Helmut Muster and she verified medical and surgical history. She and her sister will be bringing pt on Friday. She states her mother had tachycardia years ago but otherwise no cardiac history. Also spoke with Christinia Gully RN at [facility] and she verified medications. Gave pre-op instructions according to Pre-op Call Checklist to both pt's daughter, [name] and her nurse, Elisa.

## 2015-05-22 NOTE — H&P (Signed)
  Subjective:    Patient ID: Sheryl Shaw is a 82 y.o. female.  HPI  Referred by Dr. Elvera Lennox for treatment of scalp lesion. Patient lives in assisted living facility, daughters are POA. . She presented to [facility] ED last week with scalp bleeding.Mass has been present since 11/2014. Had fall at that time and seen in ED and had two staples placed scalp in this area. Readmitted 2 weeks later with PNA and was discharged on hospice. Notes onset of mass since that time with rapid growth. Daughter states that the patient does have a habit of picking at the skin in that area. Has been unable to wash area as will start bleeding. Biopsy completed by Dr. 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  Underwent biopsy and ED&C right cheek basal cell ca with Dr. Elvera Lennox last week.  Last ECHO 2012 with EF 55-65%. No history MI. Patient able to ambuate, but has dementia and poor hearing. DNR Review of Systems  Constitutional: Positive for fatigue.  HENT: Positive for hearing loss.  Respiratory: Positive for cough.  Skin: Positive for wound.  Hematological: Bruises/bleeds easily.   Objective:   Physical Exam  HENT:  Right vertex scalp with mass with adherent scab 4.5 x4.5 x 4 cm  Cardiovascular: Normal rate, regular rhythm and normal heart sounds.  Pulmonary/Chest: Effort normal and breath sounds normal.  Neurological: She is alert.      Assessment:     Spindle cell ca scalp    Plan:     Bleeding malignancy scalp. Given age, comorbidities daughter and I agree we will try to do minimum of procedures. Plan excision and placement Integra. Counseled given size, there is high risk invasion bone. Will plan burring outer table bone at time of excision, but if involved, this would require bone excision, flap closure and family would not elect for this. Reviewed with Integra will have bolster dressing and offered removal of this in office vs with sedation, we  agree to do in office. Plan overnight stay, but if she is doing well postoperative, consider transfer back to her facility as this may minimize confusion. Will plan surgery soon.   Both daughters have POA [name] [place]     Irene Limbo, MD [facility] Plastic & Reconstructive Surgery [phone]

## 2015-05-23 MED ORDER — CEFAZOLIN SODIUM-DEXTROSE 2-3 GM-% IV SOLR
2.0000 g | INTRAVENOUS | Status: AC
  Administered 2015-05-24: 2 g via INTRAVENOUS
  Filled 2015-05-23: qty 50

## 2015-05-24 ENCOUNTER — Ambulatory Visit (HOSPITAL_COMMUNITY): Payer: Medicare Other | Admitting: Certified Registered"

## 2015-05-24 ENCOUNTER — Encounter (HOSPITAL_COMMUNITY): Payer: Self-pay | Admitting: *Deleted

## 2015-05-24 ENCOUNTER — Observation Stay (HOSPITAL_COMMUNITY)
Admission: RE | Admit: 2015-05-24 | Discharge: 2015-05-25 | Disposition: A | Discharge status: Nursing Home (Includes ICF) | Payer: Medicare Other | Source: Ambulatory Visit | Attending: Plastic Surgery | Admitting: Plastic Surgery

## 2015-05-24 ENCOUNTER — Encounter (HOSPITAL_COMMUNITY)
Admission: RE | Disposition: A | Discharge status: Nursing Home (Includes ICF) | Payer: Self-pay | Source: Ambulatory Visit | Attending: Plastic Surgery

## 2015-05-24 DIAGNOSIS — I10 Essential (primary) hypertension: Secondary | ICD-10-CM | POA: Insufficient documentation

## 2015-05-24 DIAGNOSIS — D649 Anemia, unspecified: Secondary | ICD-10-CM | POA: Insufficient documentation

## 2015-05-24 DIAGNOSIS — K219 Gastro-esophageal reflux disease without esophagitis: Secondary | ICD-10-CM | POA: Insufficient documentation

## 2015-05-24 DIAGNOSIS — Z8673 Personal history of transient ischemic attack (TIA), and cerebral infarction without residual deficits: Secondary | ICD-10-CM | POA: Insufficient documentation

## 2015-05-24 DIAGNOSIS — Z79899 Other long term (current) drug therapy: Secondary | ICD-10-CM | POA: Insufficient documentation

## 2015-05-24 DIAGNOSIS — F0390 Unspecified dementia without behavioral disturbance: Secondary | ICD-10-CM | POA: Diagnosis not present

## 2015-05-24 DIAGNOSIS — C4449 Other specified malignant neoplasm of skin of scalp and neck: Principal | ICD-10-CM | POA: Insufficient documentation

## 2015-05-24 DIAGNOSIS — C801 Malignant (primary) neoplasm, unspecified: Secondary | ICD-10-CM | POA: Diagnosis present

## 2015-05-24 HISTORY — DX: Malignant (primary) neoplasm, unspecified: C80.1

## 2015-05-24 HISTORY — PX: MASS EXCISION: SHX2000

## 2015-05-24 HISTORY — DX: Pneumonia, unspecified organism: J18.9

## 2015-05-24 HISTORY — DX: Nausea with vomiting, unspecified: R11.2

## 2015-05-24 HISTORY — DX: Other specified postprocedural states: Z98.890

## 2015-05-24 HISTORY — DX: Unspecified dementia, unspecified severity, without behavioral disturbance, psychotic disturbance, mood disturbance, and anxiety: F03.90

## 2015-05-24 HISTORY — DX: Functional diarrhea: K59.1

## 2015-05-24 LAB — CBC
HCT: 37.4 % (ref 36.0–46.0)
Hemoglobin: 11.9 g/dL — ABNORMAL LOW (ref 12.0–15.0)
MCH: 28.5 pg (ref 26.0–34.0)
MCHC: 31.8 g/dL (ref 30.0–36.0)
MCV: 89.7 fL (ref 78.0–100.0)
PLATELETS: 291 10*3/uL (ref 150–400)
RBC: 4.17 MIL/uL (ref 3.87–5.11)
RDW: 15.2 % (ref 11.5–15.5)
WBC: 6 10*3/uL (ref 4.0–10.5)

## 2015-05-24 LAB — BASIC METABOLIC PANEL
Anion gap: 10 (ref 5–15)
BUN: 17 mg/dL (ref 6–20)
CALCIUM: 9.3 mg/dL (ref 8.9–10.3)
CHLORIDE: 108 mmol/L (ref 101–111)
CO2: 25 mmol/L (ref 22–32)
CREATININE: 0.54 mg/dL (ref 0.44–1.00)
GFR calc non Af Amer: >60 mL/min (ref >60)
GLUCOSE: 84 mg/dL (ref 65–99)
Potassium: 4 mmol/L (ref 3.5–5.1)
Sodium: 143 mmol/L (ref 135–145)

## 2015-05-24 LAB — SURGICAL PCR SCREEN
MRSA, PCR: NEGATIVE
STAPHYLOCOCCUS AUREUS: NEGATIVE

## 2015-05-24 SURGERY — EXCISION MASS
Anesthesia: General | Site: Scalp

## 2015-05-24 MED ORDER — FENTANYL CITRATE (PF) 100 MCG/2ML IJ SOLN
25.0000 ug | INTRAMUSCULAR | Status: DC | PRN
  Administered 2015-05-24 (×2): 25 ug via INTRAVENOUS

## 2015-05-24 MED ORDER — ACETAMINOPHEN 325 MG PO TABS
650.0000 mg | ORAL_TABLET | ORAL | Status: DC | PRN
  Administered 2015-05-24 – 2015-05-25 (×2): 650 mg via ORAL
  Filled 2015-05-24 (×2): qty 2

## 2015-05-24 MED ORDER — FENTANYL CITRATE (PF) 100 MCG/2ML IJ SOLN
INTRAMUSCULAR | Status: AC
  Administered 2015-05-24: 25 ug via INTRAVENOUS
  Filled 2015-05-24: qty 2

## 2015-05-24 MED ORDER — PROPOFOL 10 MG/ML IV BOLUS
INTRAVENOUS | Status: DC | PRN
  Administered 2015-05-24: 40 mg via INTRAVENOUS
  Administered 2015-05-24: 20 mg via INTRAVENOUS

## 2015-05-24 MED ORDER — PHENYLEPHRINE 40 MCG/ML (10ML) SYRINGE FOR IV PUSH (FOR BLOOD PRESSURE SUPPORT)
PREFILLED_SYRINGE | INTRAVENOUS | Status: AC
  Filled 2015-05-24: qty 10

## 2015-05-24 MED ORDER — ENSURE ENLIVE PO LIQD
237.0000 mL | Freq: Three times a day (TID) | ORAL | Status: DC
  Administered 2015-05-24 – 2015-05-25 (×3): 237 mL via ORAL

## 2015-05-24 MED ORDER — LORAZEPAM 1 MG PO TABS: 1.0000 mg | ORAL_TABLET | ORAL | Status: DC | PRN

## 2015-05-24 MED ORDER — TRAMADOL HCL 50 MG PO TABS: 50.0000 mg | ORAL_TABLET | Freq: Two times a day (BID) | ORAL | Status: AC | PRN

## 2015-05-24 MED ORDER — BUPIVACAINE-EPINEPHRINE (PF) 0.25% -1:200000 IJ SOLN
INTRAMUSCULAR | Status: AC
Start: 2015-05-24 — End: 2015-05-24
  Filled 2015-05-24: qty 30

## 2015-05-24 MED ORDER — ONDANSETRON HCL 4 MG/2ML IJ SOLN
4.0000 mg | Freq: Four times a day (QID) | INTRAMUSCULAR | Status: DC | PRN
Start: 2015-05-24 — End: 2015-05-25
  Filled 2015-05-24: qty 2

## 2015-05-24 MED ORDER — PHENYLEPHRINE HCL 10 MG/ML IJ SOLN
10.0000 mg | INTRAVENOUS | Status: DC | PRN
  Administered 2015-05-24: 40 ug/min via INTRAVENOUS

## 2015-05-24 MED ORDER — LIDOCAINE HCL (CARDIAC) 20 MG/ML IV SOLN
INTRAVENOUS | Status: DC | PRN
  Administered 2015-05-24: 40 mg via INTRAVENOUS

## 2015-05-24 MED ORDER — ONDANSETRON HCL 4 MG/2ML IJ SOLN
INTRAMUSCULAR | Status: DC | PRN
  Administered 2015-05-24: 4 mg via INTRAVENOUS

## 2015-05-24 MED ORDER — MELATONIN 3 MG PO TABS: 3.0000 mg | ORAL_TABLET | Freq: Every day | ORAL | Status: DC

## 2015-05-24 MED ORDER — TRAMADOL HCL 50 MG PO TABS
25.0000 mg | ORAL_TABLET | Freq: Two times a day (BID) | ORAL | Status: DC | PRN
  Administered 2015-05-24 – 2015-05-25 (×2): 50 mg via ORAL
  Filled 2015-05-24 (×2): qty 1

## 2015-05-24 MED ORDER — LACTATED RINGERS IV SOLN
INTRAVENOUS | Status: DC
  Administered 2015-05-24: 11:00:00 via INTRAVENOUS

## 2015-05-24 MED ORDER — BUPIVACAINE-EPINEPHRINE 0.25% -1:200000 IJ SOLN
INTRAMUSCULAR | Status: DC | PRN
  Administered 2015-05-24: 30 mL

## 2015-05-24 MED ORDER — LIDOCAINE HCL (CARDIAC) 20 MG/ML IV SOLN
INTRAVENOUS | Status: AC
  Filled 2015-05-24: qty 5

## 2015-05-24 MED ORDER — KCL IN DEXTROSE-NACL 20-5-0.45 MEQ/L-%-% IV SOLN
INTRAVENOUS | Status: DC
  Administered 2015-05-24: 15:00:00 via INTRAVENOUS
  Filled 2015-05-24: qty 1000

## 2015-05-24 MED ORDER — PHENYLEPHRINE HCL 10 MG/ML IJ SOLN
INTRAMUSCULAR | Status: DC | PRN
  Administered 2015-05-24: 80 ug via INTRAVENOUS
  Administered 2015-05-24: 40 ug via INTRAVENOUS
  Administered 2015-05-24: 80 ug via INTRAVENOUS
  Administered 2015-05-24 (×2): 40 ug via INTRAVENOUS
  Administered 2015-05-24: 120 ug via INTRAVENOUS

## 2015-05-24 MED ORDER — FENTANYL CITRATE (PF) 250 MCG/5ML IJ SOLN
INTRAMUSCULAR | Status: AC
  Filled 2015-05-24: qty 5

## 2015-05-24 MED ORDER — CALCIUM CARBONATE ANTACID 500 MG PO CHEW: 1.0000 | CHEWABLE_TABLET | Freq: Three times a day (TID) | ORAL | Status: DC | PRN

## 2015-05-24 MED ORDER — SUCCINYLCHOLINE CHLORIDE 20 MG/ML IJ SOLN
INTRAMUSCULAR | Status: DC | PRN
  Administered 2015-05-24: 20 mg via INTRAVENOUS

## 2015-05-24 MED ORDER — 0.9 % SODIUM CHLORIDE (POUR BTL) OPTIME
TOPICAL | Status: DC | PRN
  Administered 2015-05-24: 1000 mL

## 2015-05-24 MED ORDER — ONDANSETRON 4 MG PO TBDP: 4.0000 mg | ORAL_TABLET | Freq: Four times a day (QID) | ORAL | Status: DC | PRN

## 2015-05-24 MED ORDER — PROPOFOL 10 MG/ML IV BOLUS
INTRAVENOUS | Status: AC
  Filled 2015-05-24: qty 20

## 2015-05-24 MED ORDER — ONDANSETRON HCL 4 MG/2ML IJ SOLN
INTRAMUSCULAR | Status: AC
Start: 2015-05-24 — End: 2015-05-24
  Filled 2015-05-24: qty 2

## 2015-05-24 MED ORDER — SERTRALINE HCL 50 MG PO TABS
25.0000 mg | ORAL_TABLET | Freq: Every day | ORAL | Status: DC
  Administered 2015-05-24: 25 mg via ORAL
  Filled 2015-05-24: qty 1

## 2015-05-24 SURGICAL SUPPLY — 68 items
APL SKNCLS STERI-STRIP NONHPOA (GAUZE/BANDAGES/DRESSINGS)
BENZOIN TINCTURE PRP APPL 2/3 (GAUZE/BANDAGES/DRESSINGS) IMPLANT
BLADE CLIPPER SURG (BLADE) IMPLANT
BLADE SURG 11 STRL SS (BLADE) IMPLANT
BLADE SURG 15 STRL LF DISP TIS (BLADE) ×1 IMPLANT
BLADE SURG 15 STRL SS (BLADE) ×3
BUR EGG ELITE 4.0 (BURR) ×1 IMPLANT
BUR EGG ELITE 4.0MM (BURR) ×1
CANISTER SUCT 1200ML W/VALVE (MISCELLANEOUS) IMPLANT
CHLORAPREP W/TINT 26ML (MISCELLANEOUS) ×3 IMPLANT
CLOSURE WOUND 1/2 X4 (GAUZE/BANDAGES/DRESSINGS)
COVER BACK TABLE 60X90IN (DRAPES) ×3 IMPLANT
COVER MAYO STAND STRL (DRAPES) ×3 IMPLANT
DRAIN SNY 10 ROU (WOUND CARE) IMPLANT
DRAPE PED LAPAROTOMY (DRAPES) IMPLANT
DRAPE U-SHAPE 76X120 STRL (DRAPES) ×3 IMPLANT
DRSG EMULSION OIL 3X3 NADH (GAUZE/BANDAGES/DRESSINGS) ×2 IMPLANT
ELECT BLADE 4.0 EZ CLEAN MEGAD (MISCELLANEOUS) ×3
ELECT COATED BLADE 2.86 ST (ELECTRODE) IMPLANT
ELECT NDL BLADE 2-5/6 (NEEDLE) ×1 IMPLANT
ELECT NEEDLE BLADE 2-5/6 (NEEDLE) ×3 IMPLANT
ELECT REM PT RETURN 9FT ADLT (ELECTROSURGICAL)
ELECT REM PT RETURN 9FT PED (ELECTROSURGICAL)
ELECTRODE BLDE 4.0 EZ CLN MEGD (MISCELLANEOUS) IMPLANT
ELECTRODE REM PT RETRN 9FT PED (ELECTROSURGICAL) IMPLANT
ELECTRODE REM PT RTRN 9FT ADLT (ELECTROSURGICAL) IMPLANT
EVACUATOR SILICONE 100CC (DRAIN) IMPLANT
GAUZE SPONGE 2X2 8PLY STRL LF (GAUZE/BANDAGES/DRESSINGS) IMPLANT
GAUZE XEROFORM 1X8 LF (GAUZE/BANDAGES/DRESSINGS) IMPLANT
GLOVE BIO SURGEON STRL SZ 6 (GLOVE) ×3 IMPLANT
GLOVE BIO SURGEON STRL SZ 6.5 (GLOVE) ×3 IMPLANT
GLOVE BIO SURGEONS STRL SZ 6.5 (GLOVE) ×3
GOWN STRL REUS W/ TWL LRG LVL3 (GOWN DISPOSABLE) ×2 IMPLANT
GOWN STRL REUS W/TWL LRG LVL3 (GOWN DISPOSABLE) ×6
KIT BASIN OR (CUSTOM PROCEDURE TRAY) ×3 IMPLANT
LIQUID BAND (GAUZE/BANDAGES/DRESSINGS) IMPLANT
MATRIX WOUND MESHED 2X2 (Tissue) IMPLANT
NDL HYPO 30X.5 LL (NEEDLE) IMPLANT
NEEDLE 22X1 1/2 (OR ONLY) (NEEDLE) ×2 IMPLANT
NEEDLE 27GAX1X1/2 (NEEDLE) ×3 IMPLANT
NEEDLE HYPO 30X.5 LL (NEEDLE) IMPLANT
NS IRRIG 1000ML POUR BTL (IV SOLUTION) IMPLANT
PENCIL BUTTON HOLSTER BLD 10FT (ELECTRODE) ×2 IMPLANT
RUBBERBAND STERILE (MISCELLANEOUS) IMPLANT
SHEET MEDIUM DRAPE 40X70 STRL (DRAPES) IMPLANT
SPONGE GAUZE 2X2 STER 10/PKG (GAUZE/BANDAGES/DRESSINGS)
SPONGE GAUZE 4X4 12PLY STER LF (GAUZE/BANDAGES/DRESSINGS) IMPLANT
SPONGE LAP 18X18 X RAY DECT (DISPOSABLE) ×2 IMPLANT
STAPLER VISISTAT 35W (STAPLE) ×2 IMPLANT
STRIP CLOSURE SKIN 1/2X4 (GAUZE/BANDAGES/DRESSINGS) IMPLANT
SUCTION FRAZIER HANDLE 10FR (MISCELLANEOUS)
SUCTION TUBE FRAZIER 10FR DISP (MISCELLANEOUS) IMPLANT
SUT CHROMIC 5 0 P 3 (SUTURE) ×4 IMPLANT
SUT ETHILON 4 0 PS 2 18 (SUTURE) IMPLANT
SUT MNCRL AB 4-0 PS2 18 (SUTURE) IMPLANT
SUT MON AB 5-0 P3 18 (SUTURE) IMPLANT
SUT PLAIN 5 0 P 3 18 (SUTURE) IMPLANT
SUT PROLENE 5 0 P 3 (SUTURE) IMPLANT
SUT PROLENE 6 0 P 1 18 (SUTURE) IMPLANT
SUT SILK 2 0 SH (SUTURE) ×2 IMPLANT
SUT VICRYL 4-0 PS2 18IN ABS (SUTURE) IMPLANT
SYR BULB 3OZ (MISCELLANEOUS) IMPLANT
SYR CONTROL 10ML LL (SYRINGE) IMPLANT
TOWEL OR 17X24 6PK STRL BLUE (TOWEL DISPOSABLE) ×3 IMPLANT
TRAY ENT MC OR (CUSTOM PROCEDURE TRAY) ×3 IMPLANT
TUBE CONNECTING 20'X1/4 (TUBING)
TUBE CONNECTING 20X1/4 (TUBING) IMPLANT
WOUND MATRIX MESHED 2X2 (Tissue) ×2 IMPLANT

## 2015-05-24 NOTE — Op Note (Signed)
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  POSTOPERATIVE DIAGNOSES:  same  PROCEDURE:  1. Excision malignant lesion scalp 5.5 cm 2. Application Integra derma substrate 22 cm2  SURGEON: Irene Limbo MD MBA  ASSISTANT: mnimal  ANESTHESIA:  General.   EBL: 10 ml  COMPLICATIONS: None.   INDICATIONS FOR PROCEDURE:  The patient, Sheryl Shaw, is a 82 y.o. female born on February 06, 1925, is here for excision biopsy proven spindle cell malignancy scalp.   FINDINGS: 4 cm diameter ulcerated fungating mass, no gross evidence bone adherence or invasion  DESCRIPTION OF PROCEDURE:  The patient's operative site was marked with the patient and family in the preoperative area. The patient was taken to the operating room. SCDs were placed and IV antibiotics were given. The patient's operative site was prepped and draped in a sterile fashion. A time out was performed and all information was confirmed to be correct. Local anesthetic infiltrated surrounding mass. Sharp excision of mass with borders completed, diameter 5.5 cm. Mass excised full thickness of scalp including galea. Burr used to remove outer table skull until punctate bleeding encountered. Wound irrigated and hemostasis obtained. Meshed Integra inset to skin margins with 5-0 chromic. Bolster dressing of Adaptic, sterile sponge applied.  The patient was allowed to wake from anesthesia, extubated and taken to the recovery room in satisfactory condition.   SPECIMENS: spindle cell neoplasm scalp  DRAINS: none  Irene Limbo, MD [facility] Plastic & Reconstructive Surgery [phone]

## 2015-05-24 NOTE — Progress Notes (Signed)
Preop information obtained via Becky Augusta, HCPOA/daughter.

## 2015-05-24 NOTE — Interval H&P Note (Signed)
History and Physical Interval Note:  05/24/2015 10:47 AM  Sheryl Shaw  has presented today for surgery, with the diagnosis of spindle cell carcinoma scalp  The various methods of treatment have been discussed with the patient and family. After consideration of risks, benefits and other options for treatment, the patient has consented to  Procedure(s): EXCISION OF MALIGNANT LESION SCALP 6 CM AND APPLICTAION OF INTEGRA (N/A) as a surgical intervention .  The patient's history has been reviewed, patient examined, no change in status, stable for surgery.  I have reviewed the patient's chart and labs.  Questions were answered to the patient's satisfaction.     Aerion Bagdasarian

## 2015-05-24 NOTE — Anesthesia Postprocedure Evaluation (Signed)
Anesthesia Post Note  Patient: Sheryl Shaw  Procedure(s) Performed: Procedure(s) (LRB): EXCISION OF MALIGNANT LESION SCALP 6 CM AND APPLICTAION OF INTEGRA (N/A)  Patient location during evaluation: PACU Anesthesia Type: General Level of consciousness: awake and alert Pain management: pain level controlled Vital Signs Assessment: post-procedure vital signs reviewed and stable Respiratory status: spontaneous breathing, nonlabored ventilation and respiratory function stable Cardiovascular status: blood pressure returned to baseline and stable Postop Assessment: no signs of nausea or vomiting Anesthetic complications: no    Last Vitals:  Filed Vitals:   05/24/15 1400 05/24/15 1428  BP: 140/69 146/78  Pulse: 71 78  Temp: 36.4 C 36.3 C  Resp: 15 15    Last Pain: There were no vitals filed for this visit.               Aditi Rovira,W. EDMOND

## 2015-05-24 NOTE — Anesthesia Procedure Notes (Signed)
Procedure Name: Intubation Date/Time: 05/24/2015 12:12 PM Performed by: Sampson Si E Pre-anesthesia Checklist: Patient identified, Emergency Drugs available, Suction available, Patient being monitored and Timeout performed Patient Re-evaluated:Patient Re-evaluated prior to inductionOxygen Delivery Method: Circle system utilized Preoxygenation: Pre-oxygenation with 100% oxygen Intubation Type: IV induction Ventilation: Mask ventilation without difficulty Laryngoscope Size: Mac and 3 Grade View: Grade I Tube type: Oral Tube size: 7.0 mm Number of attempts: 1 Airway Equipment and Method: Stylet Placement Confirmation: ETT inserted through vocal cords under direct vision,  positive ETCO2 and breath sounds checked- equal and bilateral Secured at: 21 cm Tube secured with: Tape Dental Injury: Teeth and Oropharynx as per pre-operative assessment

## 2015-05-24 NOTE — Anesthesia Preprocedure Evaluation (Addendum)
Anesthesia Evaluation  Patient identified by MRN, date of birth, ID band Patient unresponsive    Reviewed: Allergy & Precautions, H&P , NPO status , Patient's Chart, lab work & pertinent test results  Airway Mallampati: II  TM Distance: >3 FB Neck ROM: Full    Dental no notable dental hx. (+) Edentulous Upper, Partial Lower, Dental Advisory Given   Pulmonary neg pulmonary ROS,    Pulmonary exam normal breath sounds clear to auscultation       Cardiovascular hypertension, Pt. on medications  Rhythm:Regular Rate:Normal     Neuro/Psych Dementia CVA, No Residual Symptoms    GI/Hepatic Neg liver ROS, GERD  Medicated and Controlled,  Endo/Other  negative endocrine ROS  Renal/GU negative Renal ROS  negative genitourinary   Musculoskeletal   Abdominal   Peds  Hematology negative hematology ROS (+) anemia ,   Anesthesia Other Findings   Reproductive/Obstetrics negative OB ROS                            Anesthesia Physical Anesthesia Plan  ASA: III  Anesthesia Plan: General   Post-op Pain Management:    Induction: Intravenous  Airway Management Planned: Oral ETT  Additional Equipment:   Intra-op Plan:   Post-operative Plan: Extubation in OR  Informed Consent: I have reviewed the patients History and Physical, chart, labs and discussed the procedure including the risks, benefits and alternatives for the proposed anesthesia with the patient or authorized representative who has indicated his/her understanding and acceptance.   Dental advisory given  Plan Discussed with: CRNA  Anesthesia Plan Comments:         Anesthesia Quick Evaluation

## 2015-05-24 NOTE — Transfer of Care (Signed)
Immediate Anesthesia Transfer of Care Note  Patient: Sheryl Shaw  Procedure(s) Performed: Procedure(s): EXCISION OF MALIGNANT LESION SCALP 6 CM AND APPLICTAION OF INTEGRA (N/A)  Patient Location: PACU  Anesthesia Type:General  Level of Consciousness: lethargic  Airway & Oxygen Therapy: Patient Spontanous Breathing  Post-op Assessment: Report given to RN  Post vital signs: Reviewed and stable  Last Vitals:  Filed Vitals:   05/24/15 1001 05/24/15 1317  BP: 159/63 148/69  Pulse: 62   Temp: 36.1 C 35.6 C  Resp: 14     Complications: No apparent anesthesia complications

## 2015-05-25 DIAGNOSIS — C4449 Other specified malignant neoplasm of skin of scalp and neck: Secondary | ICD-10-CM | POA: Diagnosis not present

## 2015-05-25 NOTE — Progress Notes (Signed)
Pt ready for DC, with daughters back to [facility] this am.  Rx for Ultram given and explained.  DC AVS given and explained and copy for daughters as well as [facility] to have a copy of instructions.  Dtr will take her in her personal car back.

## 2015-05-25 NOTE — Discharge Summary (Signed)
Physician Discharge Summary  Patient ID: Sheryl Shaw MRN: [id number] DOB/AGE: Jul 03, 1924 82 y.o.  Admit date: 05/24/2015 Discharge date: 05/25/2015  Admission Diagnoses: Spindle cell neoplasm scalp  Discharge Diagnoses:  Active Problems:   Spindle cell carcinoma [facility])   Discharged Condition: stable  Hospital Course: Underwent excision large mass scalp. Patient kept for observation given age and dementia. Had some nausea post operatively, resolved and tolerated regular diet. On POD# appears to be baseline mentally.  Treatments: surgery: excision malignant lesion scalp, application Integra  Discharge Exam: Blood pressure 141/68, pulse 77, temperature 97.9 F (36.6 C), temperature source Oral, resp. rate 17, weight 45.9 kg (101 lb 3.1 oz), SpO2 100 %. Incision/Wound: sponge bolster scalp dry, intact  Disposition: 01-Home or Self Care  Discharge Instructions    Call MD for:  redness, tenderness, or signs of infection (pain, swelling, bleeding, redness, odor or green/yellow discharge around incision site)    Complete by:  As directed      Call MD for:  temperature >100.5    Complete by:  As directed      Discharge instructions    Complete by:  As directed   Keep yellow sponge dressing dry until follow up. Ok to sponge bathe face and remainder hair. Ok to shower body.  Keep head elevated while reclining, in bed. Ice packs as desired for comfort.     Resume previous diet    Complete by:  As directed             Medication List    TAKE these medications        acetaminophen 325 MG tablet  Commonly known as:  TYLENOL  Take 650 mg by mouth every 4 (four) hours as needed for mild pain or fever.     calcium carbonate 500 MG chewable tablet  Commonly known as:  TUMS - dosed in mg elemental calcium  Chew 1 tablet by mouth 3 (three) times daily as needed for indigestion or heartburn.     ENSURE PO  Take 1 Bottle by mouth 3 (three) times daily. Take 1 shake 900,1700,and  2100     LORazepam 1 MG tablet  Commonly known as:  ATIVAN  Take 1 tablet (1 mg total) by mouth every 4 (four) hours as needed for anxiety or sedation. Crush with puree     Melatonin 3 MG Tabs  Take 3 mg by mouth at bedtime.     sertraline 25 MG tablet  Commonly known as:  ZOLOFT  Take 25 mg by mouth at bedtime.     traMADol 50 MG tablet  Commonly known as:  ULTRAM  Take 1 tablet (50 mg total) by mouth every 12 (twelve) hours as needed for moderate pain.     Zinc 50 MG Caps  Take 50 mg by mouth daily.           Follow-up Information    Follow up with Irene Limbo, MD On 05/30/2015.   Specialty:  Plastic Surgery   Why:  as scheduled   Contact information:   [place] [address] [phone]       Signed: Irene Limbo 05/25/2015, 9:12 AM

## 2015-05-27 ENCOUNTER — Encounter (HOSPITAL_COMMUNITY): Payer: Self-pay | Admitting: Plastic Surgery

## 2015-10-29 ENCOUNTER — Emergency Department (HOSPITAL_COMMUNITY): Payer: Medicare Other

## 2015-10-29 ENCOUNTER — Encounter (HOSPITAL_COMMUNITY): Payer: Self-pay | Admitting: Emergency Medicine

## 2015-10-29 ENCOUNTER — Emergency Department (HOSPITAL_COMMUNITY)
Admission: EM | Admit: 2015-10-29 | Discharge: 2015-10-30 | Disposition: A | Discharge status: Home / Self Care | Payer: Medicare Other | Attending: Emergency Medicine | Admitting: Emergency Medicine

## 2015-10-29 DIAGNOSIS — S0101XA Laceration without foreign body of scalp, initial encounter: Secondary | ICD-10-CM | POA: Diagnosis not present

## 2015-10-29 DIAGNOSIS — Y999 Unspecified external cause status: Secondary | ICD-10-CM | POA: Insufficient documentation

## 2015-10-29 DIAGNOSIS — Z85828 Personal history of other malignant neoplasm of skin: Secondary | ICD-10-CM | POA: Insufficient documentation

## 2015-10-29 DIAGNOSIS — W19XXXA Unspecified fall, initial encounter: Secondary | ICD-10-CM | POA: Diagnosis not present

## 2015-10-29 DIAGNOSIS — Y9389 Activity, other specified: Secondary | ICD-10-CM | POA: Diagnosis not present

## 2015-10-29 DIAGNOSIS — I10 Essential (primary) hypertension: Secondary | ICD-10-CM | POA: Insufficient documentation

## 2015-10-29 DIAGNOSIS — Y9289 Other specified places as the place of occurrence of the external cause: Secondary | ICD-10-CM | POA: Diagnosis not present

## 2015-10-29 DIAGNOSIS — S0990XA Unspecified injury of head, initial encounter: Secondary | ICD-10-CM | POA: Diagnosis present
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
## 2015-10-29 NOTE — ED Triage Notes (Signed)
[facility]ility [facility], pt had a fall at 630 pm un witness. Has back right side hematoma.  Lac on left side tibia slow bleed, and right humerus skin tear. Not on blood thinners. Dementia, and irregular heart beat. Pt has medicare and [employer] cross [employer].  Pt is DNR. Pt is at norm baseline for dementia. /vs on arrival 110/60, pulse 77 irreg ( AFIB possible)  cbg 192, spo2 100 room air.

## 2015-10-29 NOTE — ED Provider Notes (Signed)
[facility] DEPT Provider Note   CSN: [id number] Arrival date & time: 10/29/15  1958  First Provider Contact:  First MD Initiated Contact with Patient 10/29/15 2106        History   Chief Complaint Chief Complaint  Patient presents with  . Fall    HPI Sheryl Shaw is a 82 y.o. female.  Patient is a 82 year old female here with a history of dementia who presents after a fall. She lives in a skilled nursing facility. Her son is with her. He states that she falls frequently. The patient had told him that she was trying to get some clothes on and fell.  Has a knot on the back of her head. [name] been acting normally since the incident. She hasn't been complaining of any particular areas of pain.    Fall     Past Medical History:  Diagnosis Date  . Anemia   . Arteriosclerotic heart disease   . Cancer ([place])    skin cancer  . Cataract   . Chronic fatigue   . Dementia   . Diarrhea, functional   . DNR (do not resuscitate)   . GERD (gastroesophageal reflux disease)   . Hypertension   . Nausea   . Optic nerve disorder    right  . Palpitations   . Pneumonia   . PONV (postoperative nausea and vomiting)   . Stroke [facility])    TIA  . Syncope 2007  . Vertebrobasilar TIAs     Patient Active Problem List   Diagnosis Date Noted  . 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 Dementia with behavioral disturbance 12/31/2014  . Dementia without behavioral disturbance   . Hallucinations   . Pneumonia 12/30/2014  . Aspiration pneumonia due to gastric secretions ([place]) 12/30/2014  . Acute encephalopathy 12/30/2014  . Protein-calorie malnutrition, severe ([place]) 05/02/2014  . Sepsis ([place]) 05/01/2014  . SIRS (systemic inflammatory response syndrome) ([place]) 05/01/2014  . Nausea vomiting and diarrhea 05/01/2014  . Elevated LFTs 10/07/2011  . Essential hypertension 10/29/2010  . Edema leg 08/29/2010  . Palpitation 08/29/2010  . Personal history of transient ischemic attack (TIA)  and cerebral infarction without residual deficit 08/29/2010    Past Surgical History:  Procedure Laterality Date  . CATARACT EXTRACTION, BILATERAL    . CHOLECYSTECTOMY    . COLONOSCOPY    . esophageal dilitation    . MASS EXCISION N/A 05/24/2015   Procedure: EXCISION OF MALIGNANT LESION SCALP 6 CM AND APPLICTAION OF INTEGRA;  Surgeon: Irene Limbo, MD;  Location: [facility];  Service: Plastics;  Laterality: N/A;  . POLYPECTOMY    . RECTAL POLYPECTOMY    . TRANSTHORACIC ECHOCARDIOGRAM  04/02/2005   EF 50-60%  . UPPER GASTROINTESTINAL ENDOSCOPY      OB History    No data available       Home Medications    Prior to Admission medications   Medication Sig Start Date End Date Taking? Authorizing Provider  acetaminophen (TYLENOL) 325 MG tablet Take 650 mg by mouth every 4 (four) hours as needed for mild pain or fever.    Yes Historical Provider, MD  calcium carbonate (TUMS - DOSED IN MG ELEMENTAL CALCIUM) 500 MG chewable tablet Chew 1 tablet by mouth 3 (three) times daily as needed for indigestion or heartburn.   Yes Historical Provider, MD  Melatonin 3 MG TABS Take 3 mg by mouth at bedtime.   Yes Historical Provider, MD  Morphine Sulfate (MORPHINE CONCENTRATE) 10 MG/0.5ML SOLN concentrated solution Take 10  mg by mouth every 2 (two) hours as needed for severe pain.   Yes Historical Provider, MD  traMADol (ULTRAM) 50 MG tablet Take 1 tablet (50 mg total) by mouth every 12 (twelve) hours as needed for moderate pain. 05/24/15  Yes Irene Limbo, MD  LORazepam (ATIVAN) 1 MG tablet Take 1 tablet (1 mg total) by mouth every 4 (four) hours as needed for anxiety or sedation. Crush with puree Patient not taking: Reported on 10/29/2015 01/02/15   Janece Canterbury, MD    Family History Family History  Problem Relation Age of Onset  . Heart disease Father   . Heart attack Father   . Heart disease Brother   . Heart disease Brother   . Heart disease Brother   . Heart disease Brother   . Heart  disease Brother   . Heart attack Brother   . Heart attack Brother   . Heart attack Brother   . Colon cancer Neg Hx   . Rectal cancer Neg Hx   . Stomach cancer Neg Hx     Social History Social History  Substance Use Topics  . Smoking status: Never Smoker  . Smokeless tobacco: Never Used  . Alcohol use No     Allergies   Codeine and Mucinex [guaifenesin er]   Review of Systems Review of Systems  Unable to perform ROS: Dementia     Physical Exam Updated Vital Signs BP 139/58 (BP Location: Left Arm)   Pulse 68   Resp 13   SpO2 100%   Physical Exam  Constitutional: She is oriented to person, place, and time. She appears well-developed and well-nourished.  HENT:  Head: Normocephalic and atraumatic.  Small hematoma to posterior right scalp with overlying 1cm laceration.  No active bleeding.  Eyes: Pupils are equal, round, and reactive to light.  Neck: Normal range of motion. Neck supple.  No obvious tenderness along the cervical thoracic or lumbosacral spine  Cardiovascular: Normal rate, regular rhythm and normal heart sounds.   Pulmonary/Chest: Effort normal and breath sounds normal. No respiratory distress. She has no wheezes. She has no rales. She exhibits no tenderness.  Abdominal: Soft. Bowel sounds are normal. There is no tenderness. There is no rebound and no guarding.  Musculoskeletal: Normal range of motion. She exhibits no edema.  No pain on palpation or range of motion extremities. She has a small skin tear to her left pretibial area and her right forearm.  Lymphadenopathy:    She has no cervical adenopathy.  Neurological: She is alert and oriented to person, place, and time.  Skin: Skin is warm and dry. No rash noted.  Psychiatric: She has a normal mood and affect.     ED Treatments / Results  Labs (all labs ordered are listed, but only abnormal results are displayed) Labs Reviewed - No data to display  EKG  EKG Interpretation None        Radiology Ct Head Wo Contrast  Result Date: 10/29/2015 CLINICAL DATA:  82 year old female with unwitnessed fall EXAM: CT HEAD WITHOUT CONTRAST CT CERVICAL SPINE WITHOUT CONTRAST TECHNIQUE: Multidetector CT imaging of the head and cervical spine was performed following the standard protocol without intravenous contrast. Multiplanar CT image reconstructions of the cervical spine were also generated. COMPARISON:  Head CT dated 12/30/2014 FINDINGS: CT HEAD FINDINGS There is mild age-related atrophy and chronic microvascular ischemic changes. There is no acute intracranial hemorrhage. No mass effect or midline shift noted. The visualized paranasal sinuses and mastoid air cells are  clear. The calvarium is intact. Right posterior scalp laceration. CT CERVICAL SPINE FINDINGS There is no acute fracture or subluxation of the cervical spine.There is osteopenia with multilevel degenerative changes most prominent at C5-C6 and C6-C7.The odontoid and spinous processes are intact.There is normal anatomic alignment of the C1-C2 lateral masses. The visualized soft tissues appear unremarkable. IMPRESSION: No acute intracranial hemorrhage. No acute/ traumatic cervical spine pathology. Electronically Signed   By: Anner Crete M.D.   On: 10/29/2015 23:15   Ct Cervical Spine Wo Contrast  Result Date: 10/29/2015 CLINICAL DATA:  82 year old female with unwitnessed fall EXAM: CT HEAD WITHOUT CONTRAST CT CERVICAL SPINE WITHOUT CONTRAST TECHNIQUE: Multidetector CT imaging of the head and cervical spine was performed following the standard protocol without intravenous contrast. Multiplanar CT image reconstructions of the cervical spine were also generated. COMPARISON:  Head CT dated 12/30/2014 FINDINGS: CT HEAD FINDINGS There is mild age-related atrophy and chronic microvascular ischemic changes. There is no acute intracranial hemorrhage. No mass effect or midline shift noted. The visualized paranasal sinuses and mastoid air  cells are clear. The calvarium is intact. Right posterior scalp laceration. CT CERVICAL SPINE FINDINGS There is no acute fracture or subluxation of the cervical spine.There is osteopenia with multilevel degenerative changes most prominent at C5-C6 and C6-C7.The odontoid and spinous processes are intact.There is normal anatomic alignment of the C1-C2 lateral masses. The visualized soft tissues appear unremarkable. IMPRESSION: No acute intracranial hemorrhage. No acute/ traumatic cervical spine pathology. Electronically Signed   By: Anner Crete M.D.   On: 10/29/2015 23:15    Procedures .Marland KitchenLaceration Repair Date/Time: 10/29/2015 11:11 PM Performed by: Breanna Shorkey Authorized by: Malvin Johns   Consent:    Consent obtained:  Verbal   Consent given by:  Healthcare agent   Alternatives discussed:  No treatment Anesthesia (see MAR for exact dosages):    Anesthesia method:  None Laceration details:    Location:  Scalp   Scalp location:  Occipital   Length (cm):  1.5 Repair type:    Repair type:  Simple Pre-procedure details:    Preparation:  Patient was prepped and draped in usual sterile fashion Exploration:    Contaminated: no   Treatment:    Area cleansed with:  Saline   Amount of cleaning:  Standard   Irrigation solution:  Sterile saline   Irrigation volume:  20cc   Irrigation method:  Syringe   Visualized foreign bodies/material removed: no   Skin repair:    Repair method:  Staples   Number of staples:  3 Approximation:    Approximation:  Close   Vermilion border: well-aligned   Post-procedure details:    Dressing:  Open (no dressing)   (including critical care time)  Medications Ordered in ED Medications - No data to display   Initial Impression / Assessment and Plan / ED Course  I have reviewed the triage vital signs and the nursing notes.  Pertinent labs & imaging results that were available during my care of the patient were reviewed by me and considered in my  medical decision making (see chart for details).  Clinical Course    Patient has a small laceration that was repaired in the ED. The skin tears were secured with Steri-Strips. Patient is at baseline mental status per family. CTs don't show any acute injuries. She was discharged back to the nursing facility. She was given discharge instructions to have the staples removed in one week  Final Clinical Impressions(s) / ED Diagnoses   Final diagnoses:  Scalp laceration, initial encounter  Fall, initial encounter    New Prescriptions New Prescriptions   No medications on file     Malvin Johns, MD 10/29/15 2322

## 2015-10-29 NOTE — ED Notes (Signed)
Facility nurse called pt is a hospice pt.

## 2015-10-30 NOTE — ED Notes (Signed)
Bed: WHALA Expected date:  Expected time:  Means of arrival:  Comments: 

## 2016-05-16 ENCOUNTER — Encounter (HOSPITAL_COMMUNITY): Payer: Self-pay | Admitting: Emergency Medicine

## 2016-05-16 ENCOUNTER — Emergency Department (HOSPITAL_COMMUNITY)
Admission: EM | Admit: 2016-05-16 | Discharge: 2016-05-16 | Disposition: A | Discharge status: Home / Self Care | Payer: Medicare Other | Attending: Emergency Medicine | Admitting: Emergency Medicine

## 2016-05-16 ENCOUNTER — Emergency Department (HOSPITAL_COMMUNITY): Payer: Medicare Other

## 2016-05-16 DIAGNOSIS — Z8673 Personal history of transient ischemic attack (TIA), and cerebral infarction without residual deficits: Secondary | ICD-10-CM | POA: Diagnosis not present

## 2016-05-16 DIAGNOSIS — S51011A Laceration without foreign body of right elbow, initial encounter: Secondary | ICD-10-CM | POA: Insufficient documentation

## 2016-05-16 DIAGNOSIS — Z85828 Personal history of other malignant neoplasm of skin: Secondary | ICD-10-CM | POA: Diagnosis not present

## 2016-05-16 DIAGNOSIS — S61411A Laceration without foreign body of right hand, initial encounter: Secondary | ICD-10-CM | POA: Diagnosis not present

## 2016-05-16 DIAGNOSIS — I10 Essential (primary) hypertension: Secondary | ICD-10-CM | POA: Diagnosis not present

## 2016-05-16 DIAGNOSIS — Y999 Unspecified external cause status: Secondary | ICD-10-CM | POA: Insufficient documentation

## 2016-05-16 DIAGNOSIS — W06XXXA Fall from bed, initial encounter: Secondary | ICD-10-CM | POA: Diagnosis not present

## 2016-05-16 DIAGNOSIS — Z79899 Other long term (current) drug therapy: Secondary | ICD-10-CM | POA: Diagnosis not present

## 2016-05-16 DIAGNOSIS — Y929 Unspecified place or not applicable: Secondary | ICD-10-CM | POA: Diagnosis not present

## 2016-05-16 DIAGNOSIS — S0990XA Unspecified injury of head, initial encounter: Secondary | ICD-10-CM | POA: Diagnosis present

## 2016-05-16 DIAGNOSIS — S0101XA Laceration without foreign body of scalp, initial encounter: Secondary | ICD-10-CM | POA: Diagnosis not present

## 2016-05-16 DIAGNOSIS — Y939 Activity, unspecified: Secondary | ICD-10-CM | POA: Diagnosis not present

## 2016-05-16 MED ORDER — LIDOCAINE-EPINEPHRINE (PF) 2 %-1:200000 IJ SOLN
10.0000 mL | Freq: Once | INTRAMUSCULAR | Status: AC
  Administered 2016-05-16: 10 mL
  Filled 2016-05-16: qty 20

## 2016-05-16 NOTE — ED Notes (Signed)
[facility] called for transport.  

## 2016-05-16 NOTE — ED Notes (Signed)
Bed: WA14 Expected date:  Expected time:  Means of arrival:  Comments: 

## 2016-05-16 NOTE — ED Notes (Signed)
Bed: [id number] Expected date: 05/16/16 Expected time: 10:10 AM Means of arrival: Ambulance Comments: Hold 26 elderly fall, lac to head

## 2016-05-16 NOTE — ED Triage Notes (Addendum)
Per EMS pt from [facility] at [facility] for evaluation post fall. Pt presents with hematoma to left brow, skin tear to right hand, and laceration to right parietal region. C collar in place. Pt disoriented x4 per normal. Pt is not on blood thinners. Pt fall from bed to table.

## 2016-05-16 NOTE — ED Notes (Signed)
Wet dressing placed on head laceration per provider request.

## 2016-05-16 NOTE — ED Provider Notes (Signed)
[facility] DEPT Provider Note   CSN: [id number] Arrival date & time: 05/16/16  1008     History   Chief Complaint Chief Complaint  Patient presents with  . Fall  . Head Injury    HPI Sheryl Shaw is a 83 y.o. female.  Patient is an 83 year old female who lives at the facility because of severe dementia who had a fall today. Patient's family is present in the room and states that she was sitting on the edge of the bed when she came in and she slipped out of the bed falling head first onto a bedside table leg cutting her head. There was no LOC and patient has no complaints. Family states she is acting her normal self. Tetanus shot is up-to-date. She takes no blood thinners.   The history is provided by the EMS personnel and a relative.  Fall  This is a recurrent problem. The current episode started 1 to 2 hours ago. The problem occurs constantly. The problem has not changed since onset.Associated symptoms comments: No complaints.  No LOC.  Head Injury      Past Medical History:  Diagnosis Date  . Anemia   . Arteriosclerotic heart disease   . Cancer ([place])    skin cancer  . Cataract   . Chronic fatigue   . Dementia   . Diarrhea, functional   . DNR (do not resuscitate)   . GERD (gastroesophageal reflux disease)   . Hypertension   . Nausea   . Optic nerve disorder    right  . Palpitations   . Pneumonia   . PONV (postoperative nausea and vomiting)   . Stroke [facility])    TIA  . Syncope 2007  . Vertebrobasilar TIAs     Patient Active Problem List   Diagnosis Date Noted  . 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 Dementia with behavioral disturbance 12/31/2014  . Dementia without behavioral disturbance   . Hallucinations   . Pneumonia 12/30/2014  . Aspiration pneumonia due to gastric secretions ([place]) 12/30/2014  . Acute encephalopathy 12/30/2014  . Protein-calorie malnutrition, severe ([place]) 05/02/2014  . Sepsis ([place]) 05/01/2014  . SIRS (systemic  inflammatory response syndrome) ([place]) 05/01/2014  . Nausea vomiting and diarrhea 05/01/2014  . Elevated LFTs 10/07/2011  . Essential hypertension 10/29/2010  . Edema leg 08/29/2010  . Palpitation 08/29/2010  . Personal history of transient ischemic attack (TIA) and cerebral infarction without residual deficit 08/29/2010    Past Surgical History:  Procedure Laterality Date  . CATARACT EXTRACTION, BILATERAL    . CHOLECYSTECTOMY    . COLONOSCOPY    . esophageal dilitation    . MASS EXCISION N/A 05/24/2015   Procedure: EXCISION OF MALIGNANT LESION SCALP 6 CM AND APPLICTAION OF INTEGRA;  Surgeon: Irene Limbo, MD;  Location: [facility];  Service: Plastics;  Laterality: N/A;  . POLYPECTOMY    . RECTAL POLYPECTOMY    . TRANSTHORACIC ECHOCARDIOGRAM  04/02/2005   EF 50-60%  . UPPER GASTROINTESTINAL ENDOSCOPY      OB History    No data available       Home Medications    Prior to Admission medications   Medication Sig Start Date End Date Taking? Authorizing Provider  acetaminophen (TYLENOL) 325 MG tablet Take 650 mg by mouth every 4 (four) hours as needed for mild pain or fever.    Yes Historical Provider, MD  calcium carbonate (TUMS - DOSED IN MG ELEMENTAL CALCIUM) 500 MG chewable tablet Chew 1 tablet by mouth 3 (  three) times daily as needed for indigestion or heartburn.   Yes Historical Provider, MD  furosemide (LASIX) 20 MG tablet Take 20 mg by mouth 2 (two) times daily. For 5 days for edema to left hand and left foot   Yes Historical Provider, MD  haloperidol (HALDOL) 5 MG tablet Take 5 mg by mouth 3 (three) times daily as needed for agitation.   Yes Historical Provider, MD  loperamide (IMODIUM) 2 MG capsule Take 2 mg by mouth 4 (four) times daily as needed for diarrhea or loose stools.   Yes Historical Provider, MD  Melatonin 3 MG TABS Take 3 mg by mouth at bedtime.   Yes Historical Provider, MD  Morphine Sulfate (MORPHINE CONCENTRATE) 10 MG/0.5ML SOLN concentrated solution Take 10 mg  by mouth every 2 (two) hours as needed for severe pain.   Yes Historical Provider, MD  Protein POWD Take 1 scoop by mouth 2 (two) times daily between meals.   Yes Historical Provider, MD  Skin Protectants, Misc. (DIMETHICONE-ZINC OXIDE) cream Apply 1 application topically daily. After baths and change of diapers   Yes Historical Provider, MD  traMADol (ULTRAM) 50 MG tablet Take 1 tablet (50 mg total) by mouth every 12 (twelve) hours as needed for moderate pain. 05/24/15  Yes Irene Limbo, MD  LORazepam (ATIVAN) 1 MG tablet Take 1 tablet (1 mg total) by mouth every 4 (four) hours as needed for anxiety or sedation. Crush with puree Patient not taking: Reported on 10/29/2015 01/02/15   Janece Canterbury, MD    Family History Family History  Problem Relation Age of Onset  . Heart disease Father   . Heart attack Father   . Heart disease Brother   . Heart disease Brother   . Heart disease Brother   . Heart disease Brother   . Heart disease Brother   . Heart attack Brother   . Heart attack Brother   . Heart attack Brother   . Colon cancer Neg Hx   . Rectal cancer Neg Hx   . Stomach cancer Neg Hx     Social History Social History  Substance Use Topics  . Smoking status: Never Smoker  . Smokeless tobacco: Never Used  . Alcohol use No     Allergies   Codeine and Mucinex [guaifenesin er]   Review of Systems Review of Systems  All other systems reviewed and are negative.    Physical Exam Updated Vital Signs BP 158/94 (BP Location: Left Arm)   Pulse 68   Temp 97.6 F (36.4 C) (Oral)   Resp 16   Wt 100 lb (45.4 kg)   SpO2 98%   BMI 15.66 kg/m   Physical Exam  Constitutional: She appears well-developed and well-nourished. No distress.  HENT:  Head: Normocephalic. Head is with contusion and with laceration.    Mouth/Throat: Oropharynx is clear and moist.  Eyes: Conjunctivae and EOM are normal. Pupils are equal, round, and reactive to light.  Neck: Normal range of  motion. Neck supple.  No C-spine tenderness  Cardiovascular: Normal rate, regular rhythm and intact distal pulses.   No murmur heard. Pulmonary/Chest: Effort normal and breath sounds normal. No respiratory distress. She has no wheezes. She has no rales.  Abdominal: Soft. She exhibits no distension. There is no tenderness. There is no rebound and no guarding.  Musculoskeletal: Normal range of motion. She exhibits no edema or tenderness.  Small superficial skin tear to the right dorsal hand and right elbow with mild surrounding ecchymosis.  Neurological: She is alert.  Oriented to self but when telling stories gets some information correct per family. She is able to move upper and lower extremities without difficulty  Skin: Skin is warm and dry. No rash noted. No erythema.  Psychiatric:  Pleasant and cooperative  Nursing note and vitals reviewed.    ED Treatments / Results  Labs (all labs ordered are listed, but only abnormal results are displayed) Labs Reviewed - No data to display  EKG  EKG Interpretation None       Radiology Ct Head Wo Contrast  Result Date: 05/16/2016 CLINICAL DATA:  Fall from bed. Head injury with laceration. Patient with dementia. EXAM: CT HEAD WITHOUT CONTRAST CT CERVICAL SPINE WITHOUT CONTRAST TECHNIQUE: Multidetector CT imaging of the head and cervical spine was performed following the standard protocol without intravenous contrast. Multiplanar CT image reconstructions of the cervical spine were also generated. COMPARISON:  10/29/2015 FINDINGS: CT HEAD FINDINGS Brain: No evidence of acute infarction, hemorrhage, hydrocephalus, extra-axial collection or mass lesion/mass effect. There is ventricular and sulcal enlargement reflecting age related atrophy. Patchy white matter hypoattenuation noted consistent with mild chronic microvascular ischemic change. Vascular: No hyperdense vessel or unexpected calcification. Skull: Normal. Negative for fracture or focal  lesion. Sinuses/Orbits: Globes and orbits unremarkable. Sinuses and mastoid air cells are clear. Other: Right frontal region scalp laceration. No radiopaque foreign body. CT CERVICAL SPINE FINDINGS Alignment: Mild reversal the normal cervical lordosis centered at C5-C6. No spondylolisthesis. Skull base and vertebrae: No acute fracture. No primary bone lesion or focal pathologic process. Soft tissues and spinal canal: No prevertebral fluid or swelling. No visible canal hematoma. Disc levels: Mild loss disc height at C3-C4 and C4-C5. Moderate-to-marked loss of disc height at C5-C6 and C6-C7. Mild loss of disc height at C7-T1. Facet degenerative change is noted bilaterally, most evident on the left at C2-C3, C3-C4 and C4-C5. No convincing disc herniation. Upper chest: Apical lung scarring.  No acute findings. Other: None. IMPRESSION: HEAD CT:  No acute intracranial abnormalities.  No skull fracture. CERVICAL CT:  No fracture or acute finding. Electronically Signed   By: Lajean Manes M.D.   On: 05/16/2016 11:07   Ct Cervical Spine Wo Contrast  Result Date: 05/16/2016 CLINICAL DATA:  Fall from bed. Head injury with laceration. Patient with dementia. EXAM: CT HEAD WITHOUT CONTRAST CT CERVICAL SPINE WITHOUT CONTRAST TECHNIQUE: Multidetector CT imaging of the head and cervical spine was performed following the standard protocol without intravenous contrast. Multiplanar CT image reconstructions of the cervical spine were also generated. COMPARISON:  10/29/2015 FINDINGS: CT HEAD FINDINGS Brain: No evidence of acute infarction, hemorrhage, hydrocephalus, extra-axial collection or mass lesion/mass effect. There is ventricular and sulcal enlargement reflecting age related atrophy. Patchy white matter hypoattenuation noted consistent with mild chronic microvascular ischemic change. Vascular: No hyperdense vessel or unexpected calcification. Skull: Normal. Negative for fracture or focal lesion. Sinuses/Orbits: Globes and  orbits unremarkable. Sinuses and mastoid air cells are clear. Other: Right frontal region scalp laceration. No radiopaque foreign body. CT CERVICAL SPINE FINDINGS Alignment: Mild reversal the normal cervical lordosis centered at C5-C6. No spondylolisthesis. Skull base and vertebrae: No acute fracture. No primary bone lesion or focal pathologic process. Soft tissues and spinal canal: No prevertebral fluid or swelling. No visible canal hematoma. Disc levels: Mild loss disc height at C3-C4 and C4-C5. Moderate-to-marked loss of disc height at C5-C6 and C6-C7. Mild loss of disc height at C7-T1. Facet degenerative change is noted bilaterally, most evident on the left at C2-C3,  C3-C4 and C4-C5. No convincing disc herniation. Upper chest: Apical lung scarring.  No acute findings. Other: None. IMPRESSION: HEAD CT:  No acute intracranial abnormalities.  No skull fracture. CERVICAL CT:  No fracture or acute finding. Electronically Signed   By: Lajean Manes M.D.   On: 05/16/2016 11:07    Procedures Procedures (including critical care time)  Medications Ordered in ED Medications  lidocaine-EPINEPHrine (XYLOCAINE W/EPI) 2 %-1:200000 (PF) injection 10 mL (not administered)   LACERATION REPAIR Performed by: Blanchie Dessert Authorized byBlanchie Dessert Consent: Verbal consent obtained. Risks and benefits: risks, benefits and alternatives were discussed Consent given by: patient Patient identity confirmed: provided demographic data Prepped and Draped in normal sterile fashion Wound explored  Laceration Location: right frontal scalp  Laceration Length: 5cm  No Foreign Bodies seen or palpated  Anesthesia: local infiltration  Local anesthetic: lidocaine 2% with epinephrine  Anesthetic total: 5 ml  Irrigation method: syringe Amount of cleaning: standard  Skin closure: staples and 6.0 vicryl rapide  Number of sutures: 5 sutures and 7 staples  Technique: simple interrupted  Patient tolerance:  Patient tolerated the procedure well with no immediate complications.   Initial Impression / Assessment and Plan / ED Course  I have reviewed the triage vital signs and the nursing notes.  Pertinent labs & imaging results that were available during my care of the patient were reviewed by me and considered in my medical decision making (see chart for details).    Patient is an elderly female with a mechanical fall today when she slid off the bed. Large laceration to the right frontal and parietal scalp that is currently hemostatic. No other significant injuries at this time. CT of the head and neck without acute findings. Wound repair as above. Tetanus shot up-to-date.  Final Clinical Impressions(s) / ED Diagnoses   Final diagnoses:  Laceration of scalp, initial encounter    New Prescriptions New Prescriptions   No medications on file     Blanchie Dessert, MD 05/16/16 1236

## 2016-06-28 DEATH — deceased

## 2017-01-04 IMAGING — CT CT ABD-PELV W/O CM
1 of 2 series · 14 of 32 positions shown, 18 images · non-contrast
Comparison: MRI of the pelvis performed 08/22/2004, and abdominal
ultrasound performed 11/11/2011

CLINICAL DATA: Acute onset of fatigue and low grade fever. Elevated
lactic acid and leukocytosis. Initial encounter.

EXAM:
CT ABDOMEN AND PELVIS WITHOUT CONTRAST
TECHNIQUE: Multidetector CT imaging of the abdomen and pelvis was performed
following the standard protocol without IV contrast.

[Series 2: abd/pel w/o · axial · non-contrast · 0.63mm/px · z∈[+1034,+1400]mm · 14 of 84 slices shown, 18 images]
[im 7/84  soft-tissue]
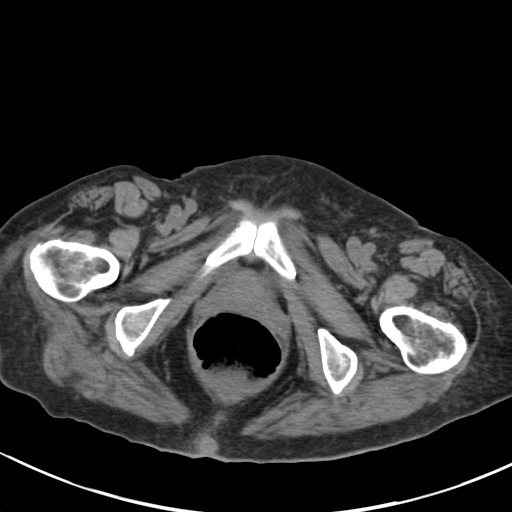
[im 7/84  bone]
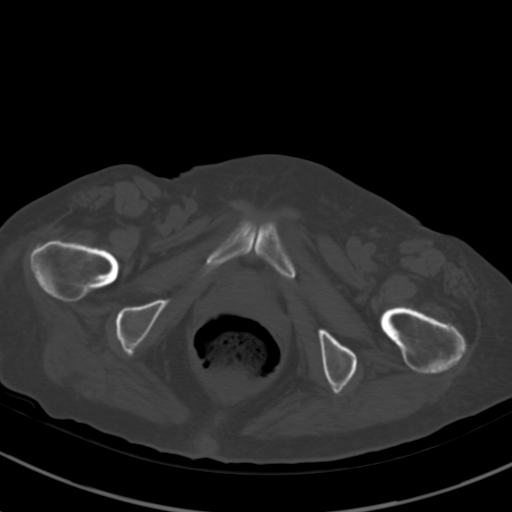
[im 13/84  soft-tissue]
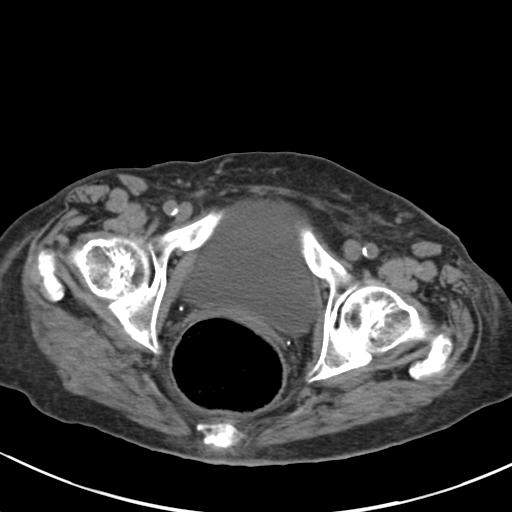
[im 20/84  soft-tissue]
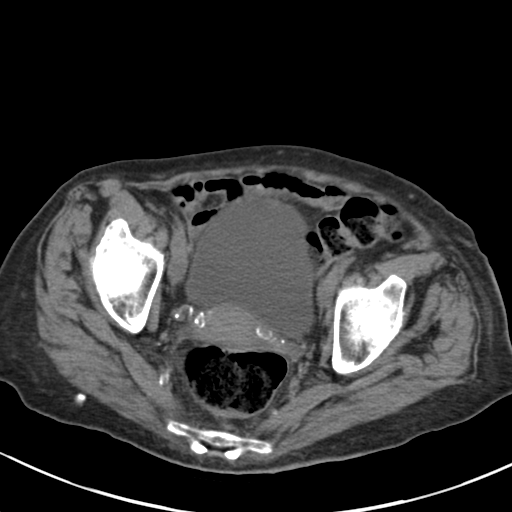
[im 26/84  soft-tissue]
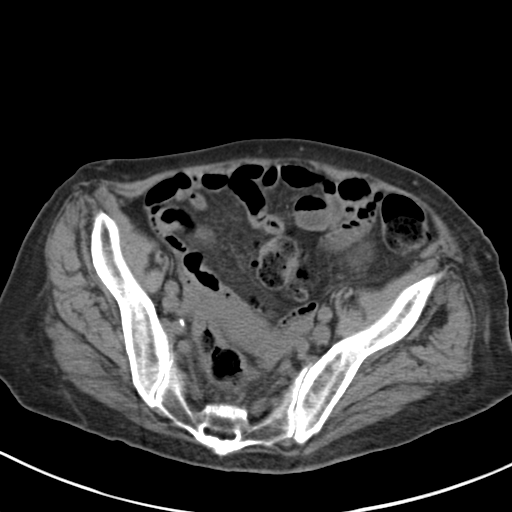
[im 32/84  soft-tissue]
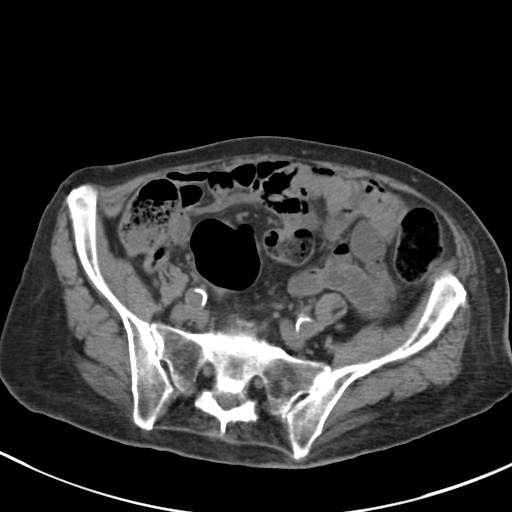
[im 39/84  soft-tissue]
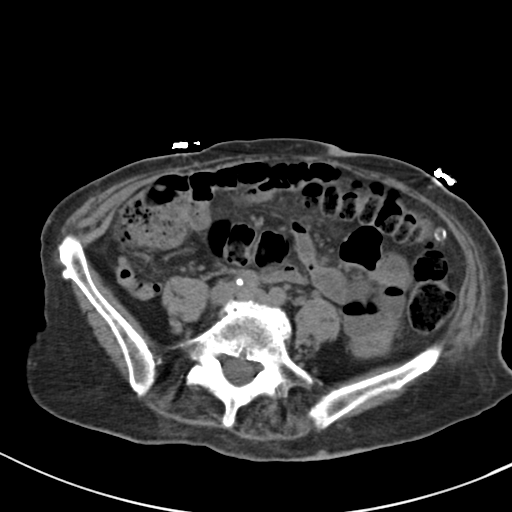
[im 45/84  soft-tissue]
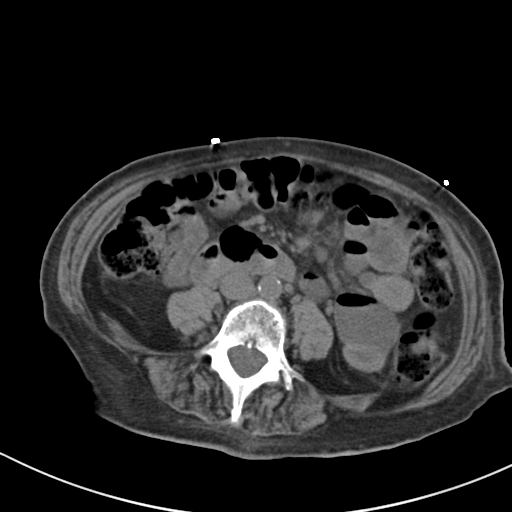
[im 52/84  soft-tissue]
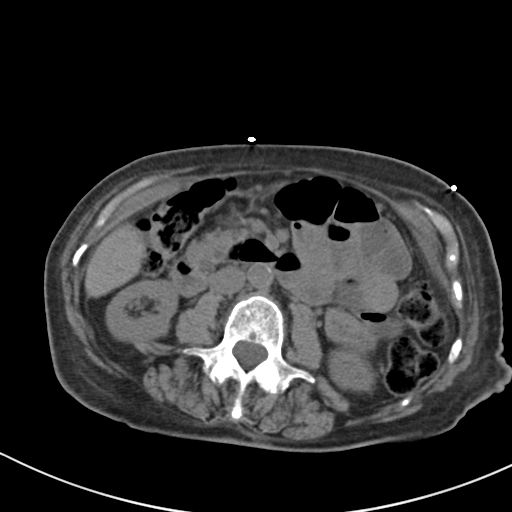
[im 58/84  soft-tissue]
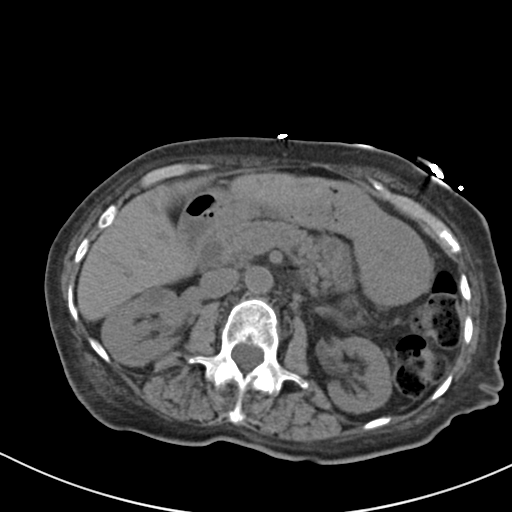
[im 58/84  bone]
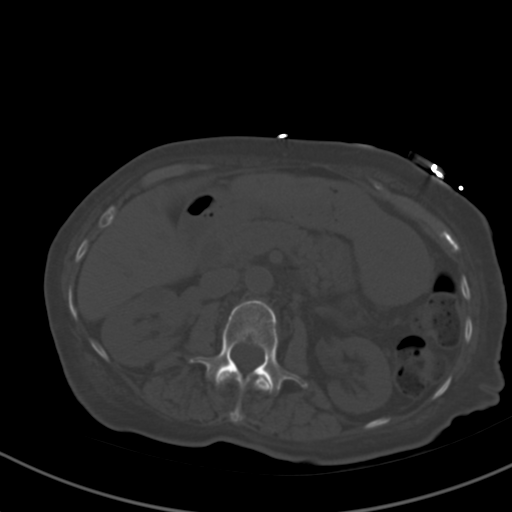
[im 64/84  soft-tissue]
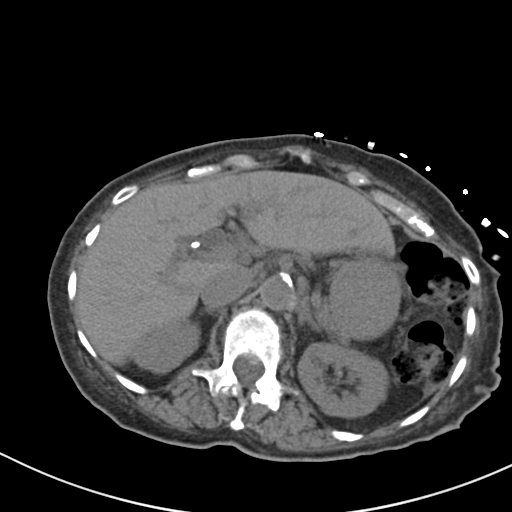
[im 71/84  soft-tissue]
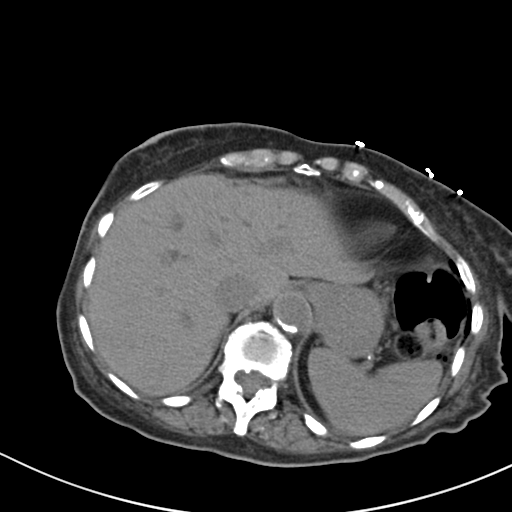
[im 71/84  lung]
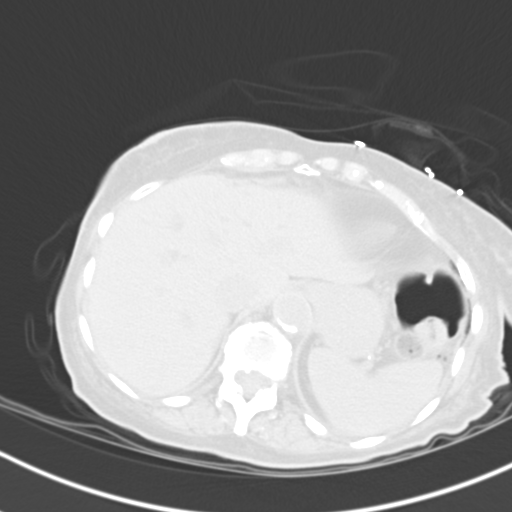
[im 74/84  lung]
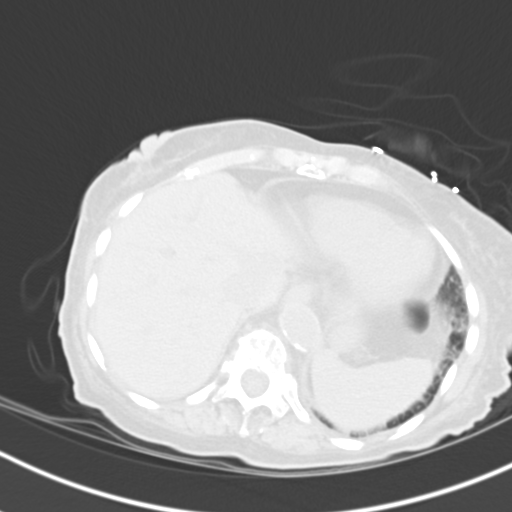
[im 77/84  soft-tissue]
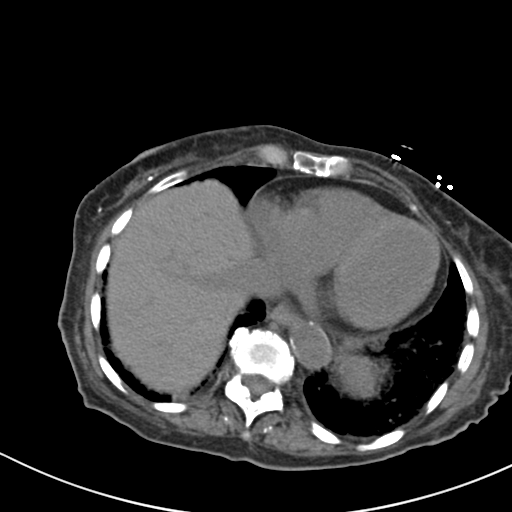
[im 77/84  lung]
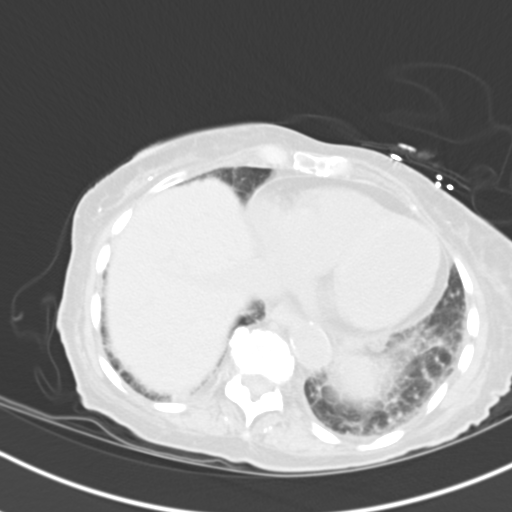
[im 80/84  lung]
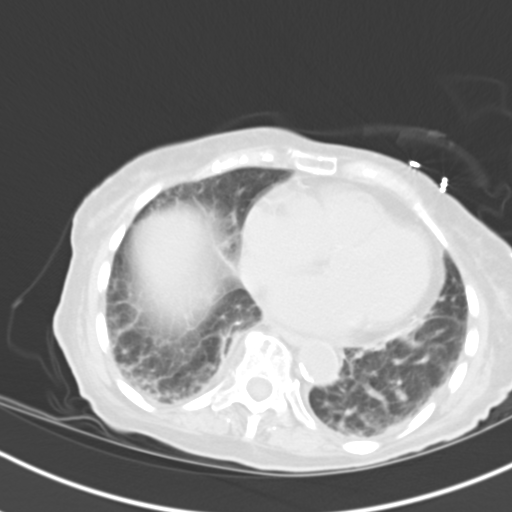

[14 of 32 positions shown; findings below may reference images not displayed]

FINDINGS: Increased interstitial markings at the lung bases may be chronic in
nature, with mild superimposed atelectasis. Scattered coronary
artery calcifications are seen. The heart is mildly enlarged.

The liver and spleen are unremarkable in appearance. The patient is
status post cholecystectomy. An apparent 6 mm retained stone is
incidentally seen. Prominence of the common hepatic duct to 1.4 cm
may simply reflect prior cholecystectomy, as there is no definite
evidence of distal obstruction. The pancreas and adrenal glands are
unremarkable.

The kidneys are unremarkable in appearance. There is no evidence of
hydronephrosis. No renal or ureteral stones are seen. No perinephric
stranding is appreciated.

No free fluid is identified. The small bowel is unremarkable in
appearance. The stomach is within normal limits. No acute vascular
abnormalities are seen. Scattered calcification is seen along the
abdominal aorta and its branches. The extent of calcification at the
proximal superior mesenteric artery raises concern for moderate to
severe stenosis.

There is minimal haziness within the mesentery, without evidence of
significant edema to suggest mesenteric ischemia.

The appendix is normal in caliber, without evidence for
appendicitis. The colon is partially filled with stool and is
unremarkable in appearance.

The bladder is moderately distended and grossly unremarkable. The
uterus is within normal limits. The ovaries are grossly symmetric.
No suspicious adnexal masses are seen. No inguinal lymphadenopathy
is seen.

No acute osseous abnormalities are identified. Vacuum phenomenon is
noted along the lower thoracic and lumbar spine, with associated
calcified disc protrusions along the lower lumbar spine. There is
chronic loss of height at vertebral bodies T12 and L1.
IMPRESSION: 1. No acute abnormality seen to explain the patient's symptoms.
2. Calcification along the proximal superior mesenteric artery
raises concern for moderate to severe stenosis. However, there is no
evidence of significant mesenteric edema at this time to suggest
mesenteric ischemia. The visualized bowel is grossly unremarkable,
with minimal nonspecific haziness in the mesentery.
3. Scattered calcification along the abdominal aorta and its
branches.
4. Increased interstitial markings at the lung bases may be chronic
in nature, with mild superimposed atelectasis.
5. Scattered coronary artery calcifications seen.
6. Mild cardiomegaly.
7. Apparent 6 mm retained stone incidentally noted at the
gallbladder fossa.

## 2017-07-29 IMAGING — CR DG PELVIS 1-2V
1 series · 1 of 1 positions shown · non-contrast
Comparison: 08/20/2014

CLINICAL DATA: 82-year-old female with fall this morning and pelvic
pain. Initial encounter.

EXAM:
PELVIS - 1-2 VIEW

[x pelvis]
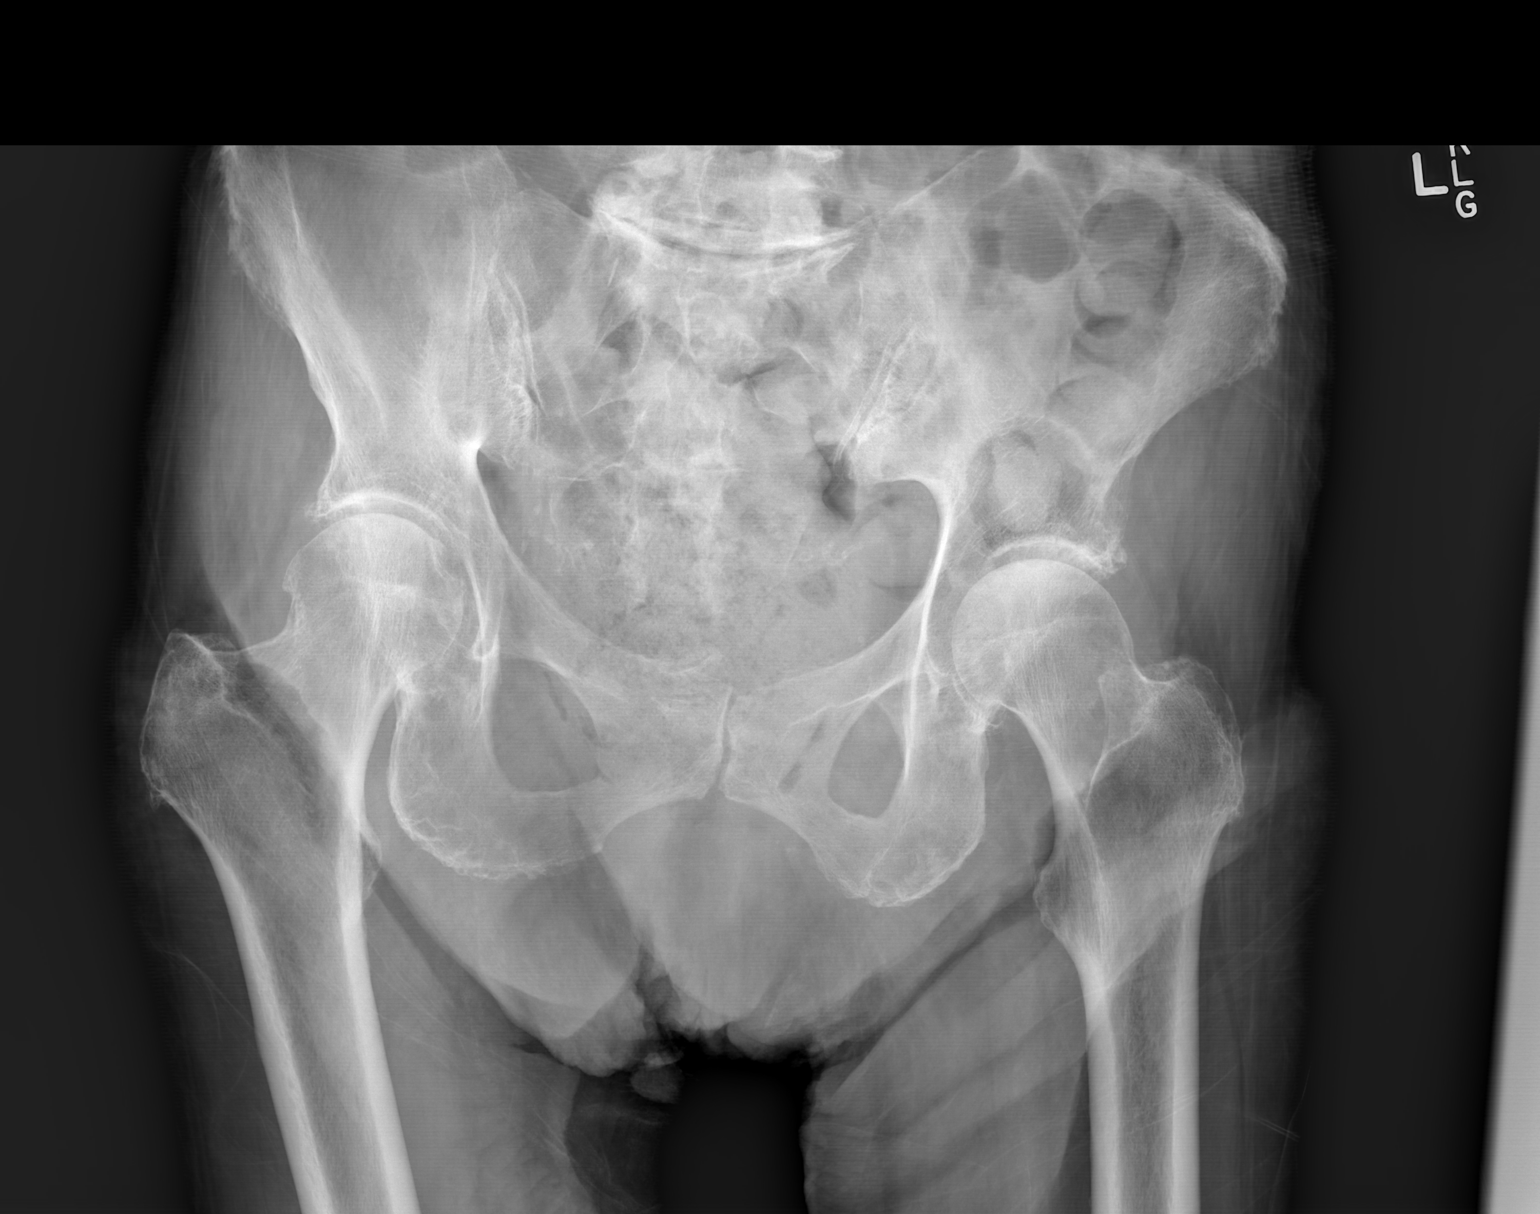

[1 of 1 positions shown; findings below may reference images not displayed]

FINDINGS: There is no evidence of fracture, subluxation or dislocation.

Degenerative changes of the hips and lower lumbar spine are again
noted.

No focal bony lesions are identified.
IMPRESSION: No acute abnormalities.
